# Patient Record
Sex: Male | Born: 1990 | Race: White | Hispanic: No | Marital: Married | Smoking: Former smoker
Health system: Southern US, Community
[De-identification: ages and names within clinical notes are randomized; demographics above are authoritative.]

---
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
## 2017-03-31 ENCOUNTER — Encounter: Payer: Self-pay | Admitting: *Deleted

## 2017-03-31 ENCOUNTER — Ambulatory Visit (INDEPENDENT_AMBULATORY_CARE_PROVIDER_SITE_OTHER): Payer: BLUE CROSS/BLUE SHIELD | Admitting: Family Medicine

## 2017-03-31 VITALS — BP 134/88 | HR 82 | Temp 98.0°F | Ht 68.0 in | Wt 187.0 lb

## 2017-03-31 DIAGNOSIS — Z7689 Persons encountering health services in other specified circumstances: Secondary | ICD-10-CM

## 2017-03-31 DIAGNOSIS — R6889 Other general symptoms and signs: Secondary | ICD-10-CM | POA: Diagnosis not present

## 2017-03-31 MED ORDER — BENZONATATE 200 MG PO CAPS: 200.0000 mg | ORAL_CAPSULE | Freq: Two times a day (BID) | ORAL | 0 refills | Status: DC | PRN

## 2017-03-31 NOTE — Progress Notes (Signed)
Subjective: ZO:XWRUEAVWUCC:establish care, URI symptoms HPI: Chanetta MarshallCameron A Davis is a 26 y.o. male presenting to clinic today for:  1. Cold symptoms  Patient reports he was seen at urgent care on 03/28/2017, where he had a rapid flu and rapid strep.  These were negative.  He was discharged with Magic mouthwash.  He reports that symptoms started about 5 days ago.  Endorses fever to 102F, body aches, rhinorrhea, dry cough.  He notes that he has not had recurrence of fever since start of symptoms.  Other symptoms are improving.  He was somewhat concerned because they were still not resolved.  Endorses one episode of nonbloody diarrhea.  Denies sinus pressure, headache, SOB, dizziness, rash, nausea, vomiting, chills, sick contacts, recent travel.  Patient has used ibuprofen 400 mg twice daily with some relief of symptoms.  No history of COPD or asthma.  Former smoker.  History reviewed. No pertinent past medical history. History reviewed. No pertinent surgical history. Social History   Social History  . Marital status: Married    Spouse name: N/A  . Number of children: N/A  . Years of education: N/A   Occupational History  . Not on file.   Social History Main Topics  . Smoking status: Former Smoker    Packs/day: 0.50    Years: 2.00    Quit date: 2015  . Smokeless tobacco: Never Used  . Alcohol use 6.0 oz/week    10 Cans of beer per week  . Drug use: No  . Sexual activity: Yes    Partners: Female     Comment: married   Other Topics Concern  . 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  He is married.  He has no children.   No outpatient prescriptions have been marked as taking for the 03/31/17 encounter (Office Visit) with Raliegh IpGottschalk, Akia Montalban M, DO.   Family History  Problem Relation Age of Onset  . COPD Mother   . Throat cancer Paternal Grandmother   . Colon cancer Paternal Grandfather 780   No Known Allergies   Health Maintenance:Flu  ROS: Per  HPI  Objective: Office vital signs reviewed. BP 134/88   Pulse 82   Temp 98 F (36.7 C) (Oral)   Ht 5\' 8"  (1.727 m)   Wt 187 lb (84.8 kg)   BMI 28.43 kg/m   Physical Examination:  General: Awake, alert, well nourished, nontoxic appearing male, No acute distress HEENT: Normal    Neck: No masses palpated. No lymphadenopathy    Ears: Tympanic membranes intact, normal light reflex, no erythema, no bulging    Eyes: PERRLA, extraocular movement in tact, sclera white, no ocular discharge.    Nose: nasal turbinates moist, clear nasal discharge, mild turbinate erythema and edema.    Throat: moist mucus membranes, mild oropharyngeal erythema, no tonsillar exudate.  Airway is patent Cardio: regular rate and rhythm, S1S2 heard, no murmurs appreciated Pulm: clear to auscultation bilaterally, no wheezes, rhonchi or rales; normal work of breathing on room air Skin: Good turgor.  Assessment/ Plan: 26 y.o. male   1. Flu-like symptoms Clinically improving just not resolved.  Patient is afebrile with normal vital signs today.  The urgent care note was reviewed.  He had a negative strep and negative rapid flu there.  I did recommend that he increase his ibuprofen to 600 mg every 8 hours.  Take this with food.  Push oral fluids.  Benzonatate 200 mg twice daily was  prescribed for cough.  Home care instructions were reviewed and a handout was provided.Strict return precautions and reasons for emergent evaluation in the emergency department review with patient.  They voiced understanding and will follow-up as needed.  2. Encounter to establish care with new doctor Patient with no prior PCP.  I encouraged him to make an appointment for a physical so that we can get him up-to-date on vaccines and any other preventative care.  Family history, past medical history, past surgical history, drug allergies and medications were reviewed with this patient today.  Raliegh Ip, DO [facility] [phone]

## 2017-03-31 NOTE — Patient Instructions (Addendum)
I reviewed your records from your recent visit to the urgent care.  You had a negative strep and negative flu swab there.  Therefore, these were not repeated today.  I suspect that you have a viral upper respiratory infection.  Prescribed use Tessalon Perles to use twice a day as needed for cough.  As we discussed, you may take the ibuprofen (3 tablets or 600 mg) every 8 hours as needed for body aches, fevers or headaches.  It appears that you have a viral upper respiratory infection (cold).  Cold symptoms can last up to 2 weeks.    - Get plenty of rest and drink plenty of fluids. - Try to breathe moist air. Use a humidifier or take a steamy shower. - Consume warm fluids (soup or tea) to provide relief for a stuffy nose and to loosen phlegm. - For nasal stuffiness, try saline nasal spray or a Neti Pot. - For sore throat pain relief: suck on throat lozenges, hard candy or popsicles; gargle with warm salt water (1/4 tsp. salt per 8 oz. of water); and eat soft, bland foods. - Eat a well-balanced diet. If you cannot, ensure you are getting enough nutrients by taking a daily multivitamin. - Avoid dairy products, as they can thicken phlegm. - Avoid alcohol, as it impairs your body's immune system.  CONTACT YOUR DOCTOR IF YOU EXPERIENCE ANY OF THE FOLLOWING: - High fever - Ear pain - Sinus-type headache - Unusually severe cold symptoms - Cough that gets worse while other cold symptoms improve - Flare up of any chronic lung problem, such as asthma - Your symptoms persist longer than 2 weeks

## 2017-09-22 ENCOUNTER — Ambulatory Visit: Payer: BLUE CROSS/BLUE SHIELD | Admitting: Family Medicine

## 2017-09-22 ENCOUNTER — Encounter: Payer: Self-pay | Admitting: Family Medicine

## 2017-09-22 VITALS — BP 126/88 | HR 57 | Temp 97.0°F | Ht 68.0 in | Wt 188.0 lb

## 2017-09-22 DIAGNOSIS — F411 Generalized anxiety disorder: Secondary | ICD-10-CM

## 2017-09-22 DIAGNOSIS — F410 Panic disorder [episodic paroxysmal anxiety] without agoraphobia: Secondary | ICD-10-CM

## 2017-09-22 MED ORDER — HYDROXYZINE HCL 10 MG PO TABS: 10.0000 mg | ORAL_TABLET | Freq: Three times a day (TID) | ORAL | 0 refills | Status: DC | PRN

## 2017-09-22 MED ORDER — ESCITALOPRAM OXALATE 10 MG PO TABS: 10.0000 mg | ORAL_TABLET | Freq: Every day | ORAL | 5 refills | Status: DC

## 2017-09-22 NOTE — Progress Notes (Signed)
BP 126/88   Pulse (!) 57   Temp (!) 97 F (36.1 C) (Oral)   Ht 5\' 8"  (1.727 m)   Wt 188 lb (85.3 kg)   BMI 28.59 kg/m    Subjective:    Patient ID: Andrew Marshallameron A Polendo, male    DOB: 03-11-1991, 27 y.o.   MRN: [id number]  HPI: Andrew Davis is a 27 y.o. male presenting on 09/22/2017 for Anxiety   HPI General anxiety and panic attacks Patient is coming in to discuss anxiety and panic attacks.  He says he frequently will get anxiety that builds up to the point where he started to feel chest pain and flutters.  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  He says he is having the leads safety problems now and that is when it builds up to him and he feels like every staring at them and he gets even more anxious.  He says this is been building up over the past couple years and he is finally seeking treatment because is the point where he just cannot take it anymore.  He says is been happening at other times outside of work now [employer] as well.  He denies any suicidal ideations or thoughts of hurting himself.  He denies any major feelings of depression or sadness. [facility] screen [facility] 2/9 09/22/2017 03/31/2017  Decreased Interest 2 0  Down, Depressed, Hopeless 2 0  PHQ - 2 Score 4 0  Altered sleeping 1 -  Tired, decreased energy 2 -  Change in appetite 0 -  Feeling bad or failure about yourself  1 -  Trouble concentrating 0 -  Moving slowly or fidgety/restless 1 -  Suicidal thoughts 0 -  PHQ-9 Score 9 -     Relevant past medical, surgical, family and social history reviewed and updated as indicated. Interim medical history since our last visit reviewed. Allergies and medications reviewed and updated.  Review of Systems  Constitutional: Negative for chills and fever.  Respiratory: Negative for shortness of breath and wheezing.   Cardiovascular: Negative for chest pain and leg swelling.  Musculoskeletal:  Negative for back pain and gait problem.  Skin: Negative for rash.  Neurological: Negative for dizziness, weakness, light-headedness and numbness.  Psychiatric/Behavioral: Positive for decreased concentration. Negative for dysphoric mood, self-injury, sleep disturbance and suicidal ideas. The patient is nervous/anxious.   All other systems reviewed and are negative.   Per HPI unless specifically indicated above   Allergies as of 09/22/2017   No Known Allergies     Medication List        Accurate as of 09/22/17  9:21 AM. Always use your most recent med list.          escitalopram 10 MG tablet Commonly known as:  LEXAPRO Take 1 tablet (10 mg total) by mouth daily.   hydrOXYzine 10 MG tablet Commonly known as:  ATARAX/VISTARIL Take 1 tablet (10 mg total) by mouth 3 (three) times daily as needed.          Objective:    BP 126/88   Pulse (!) 57   Temp (!) 97 F (36.1 C) (Oral)   Ht 5\' 8"  (1.727 m)   Wt 188 lb (85.3 kg)   BMI 28.59 kg/m   Wt Readings from Last 3 Encounters:  09/22/17 188 lb (85.3 kg)  03/31/17 187 lb (84.8 kg)    Physical  Exam  Constitutional: He is oriented to person, place, and time. He appears well-developed and well-nourished. No distress.  Eyes: Pupils are equal, round, and reactive to light. Conjunctivae and EOM are normal. Right eye exhibits no discharge. No scleral icterus.  Neck: Neck supple. No thyromegaly present.  Cardiovascular: Normal rate, regular rhythm, normal heart sounds and intact distal pulses.  No murmur heard. Pulmonary/Chest: Effort normal and breath sounds normal. No respiratory distress. He has no wheezes.  Lymphadenopathy:    He has no cervical adenopathy.  Neurological: He is alert and oriented to person, place, and time. Coordination normal.  Skin: Skin is warm and dry. No rash noted. He is not diaphoretic.  Psychiatric: His behavior is normal. Judgment normal. His mood appears anxious. Cognition and memory are normal.  He does not exhibit a depressed mood. He expresses no suicidal ideation. He expresses no suicidal plans.  Nursing note and vitals reviewed.   No results found for this or any previous visit.    Assessment & Plan:   Problem List Items Addressed This Visit      Other   Generalized anxiety disorder - Primary   Relevant Medications   escitalopram (LEXAPRO) 10 MG tablet   hydrOXYzine (ATARAX/VISTARIL) 10 MG tablet   Other Relevant Orders   CBC with Differential/Platelet   TSH   Panic attacks   Relevant Medications   escitalopram (LEXAPRO) 10 MG tablet   hydrOXYzine (ATARAX/VISTARIL) 10 MG tablet   Other Relevant Orders   CBC with Differential/Platelet   TSH       Follow up plan: Return in about 1 month (around 10/20/2017), or if symptoms worsen or fail to improve, for Anxiety and panic attacks recheck.  Counseling provided for all of the vaccine components Orders Placed This Encounter  Procedures  . CBC with Differential/Platelet  . TSH    Arville Care, MD [facility] 09/22/2017, 9:21 AM

## 2017-09-23 LAB — CBC WITH DIFFERENTIAL/PLATELET
BASOS: 1 %
Basophils Absolute: 0 10*3/uL (ref 0.0–0.2)
EOS (ABSOLUTE): 0.2 10*3/uL (ref 0.0–0.4)
EOS: 3 %
HEMATOCRIT: 42.4 % (ref 37.5–51.0)
HEMOGLOBIN: 14.9 g/dL (ref 13.0–17.7)
IMMATURE GRANS (ABS): 0 10*3/uL (ref 0.0–0.1)
Immature Granulocytes: 0 %
Lymphocytes Absolute: 2.3 10*3/uL (ref 0.7–3.1)
Lymphs: 38 %
MCH: 30.6 pg (ref 26.6–33.0)
MCHC: 35.1 g/dL (ref 31.5–35.7)
MCV: 87 fL (ref 79–97)
MONOCYTES: 8 %
Monocytes Absolute: 0.5 10*3/uL (ref 0.1–0.9)
Neutrophils Absolute: 3.1 10*3/uL (ref 1.4–7.0)
Neutrophils: 50 %
Platelets: 195 10*3/uL (ref 150–379)
RBC: 4.87 x10E6/uL (ref 4.14–5.80)
RDW: 12.9 % (ref 12.3–15.4)
WBC: 6 10*3/uL (ref 3.4–10.8)

## 2017-09-23 LAB — TSH: TSH: 3.81 u[IU]/mL (ref 0.450–4.500)

## 2018-01-02 ENCOUNTER — Emergency Department (HOSPITAL_COMMUNITY)
Admission: EM | Admit: 2018-01-02 | Discharge: 2018-01-02 | Disposition: A | Discharge status: Home / Self Care | Payer: Self-pay | Attending: Emergency Medicine | Admitting: Emergency Medicine

## 2018-01-02 DIAGNOSIS — Z5321 Procedure and treatment not carried out due to patient leaving prior to being seen by health care provider: Secondary | ICD-10-CM | POA: Insufficient documentation

## 2018-01-02 DIAGNOSIS — R600 Localized edema: Secondary | ICD-10-CM | POA: Insufficient documentation

## 2018-01-02 NOTE — ED Notes (Signed)
Pt was assessed for airway clearance because registration came back to notify this writer that the family was worried. Pt was able to have conversation with the writer, airway was intact; however, the extremities were noted to have swelling. Pt said he took benadryl five minutes before arrival. I told them that since his airway was clear that he would have to wait until we get some other patients seen. The pt's girlfriend was in tears when she heard that. She said, "[facility]."

## 2018-01-07 ENCOUNTER — Ambulatory Visit: Payer: BLUE CROSS/BLUE SHIELD | Admitting: Family Medicine

## 2018-01-07 ENCOUNTER — Encounter: Payer: Self-pay | Admitting: Family Medicine

## 2018-01-07 VITALS — BP 128/78 | HR 53 | Temp 97.6°F | Ht 68.0 in | Wt 189.2 lb

## 2018-01-07 DIAGNOSIS — T782XXA Anaphylactic shock, unspecified, initial encounter: Secondary | ICD-10-CM | POA: Diagnosis not present

## 2018-01-07 DIAGNOSIS — F410 Panic disorder [episodic paroxysmal anxiety] without agoraphobia: Secondary | ICD-10-CM | POA: Diagnosis not present

## 2018-01-07 DIAGNOSIS — L509 Urticaria, unspecified: Secondary | ICD-10-CM | POA: Diagnosis not present

## 2018-01-07 DIAGNOSIS — F411 Generalized anxiety disorder: Secondary | ICD-10-CM | POA: Diagnosis not present

## 2018-01-07 MED ORDER — HYDROXYZINE HCL 10 MG PO TABS: 10.0000 mg | ORAL_TABLET | Freq: Three times a day (TID) | ORAL | 1 refills | Status: DC | PRN

## 2018-01-07 MED ORDER — SERTRALINE HCL 50 MG PO TABS: 50.0000 mg | ORAL_TABLET | Freq: Every day | ORAL | 1 refills | Status: DC

## 2018-01-07 MED ORDER — EPINEPHRINE 0.3 MG/0.3ML IJ SOAJ: 0.3000 mg | Freq: Once | INTRAMUSCULAR | 1 refills | Status: AC

## 2018-01-07 NOTE — Progress Notes (Signed)
BP 128/78   Pulse (!) 53   Temp 97.6 F (36.4 C) (Oral)   Ht 5\' 8"  (1.727 m)   Wt 189 lb 3.2 oz (85.8 kg)   BMI 28.77 kg/m    Subjective:    Patient ID: Andrew Davis, male    DOB: Jul 06, 1990, 27 y.o.   MRN: [id number]  HPI: Andrew Davis is a 27 y.o. male presenting on 01/07/2018 for Anxiety   HPI Anxiety and panic attacks Patient is coming in with anxiety and panic attacks for recheck.  He says that he tried the Lexapro and feels like it did help with his mood but he had a twitch in his hand when he tried to do specific things like write.  He stopped the medication because of that but would like to try something different.  He says the hydroxyzine has been working very well to calm his nerves and he has not felt too sedated on it.  He says he still gets anxiety associated with work but feels good everywhere else, mainly the anxiousness is associated with his work and promotions that he is getting.  He denies any suicidal ideations or thoughts of hurting himself.  5 days ago allergic reaction Patient says that he was at a bar with his wife and friends and he had a couple of beers and a burger and then he developed not long after a rash that was hives over his body especially his chest and his face and had swelling in his face and his eyes and even felt scratchy in the back of his throat as well.  He has never had this reaction before and he has not had it since that time.  He cannot think of anything that he had that he has not had previously.  He denies any recent tick bites but we did consider doing alpha gal testing but he would like to hold off on it for now.  He would like to have an EpiPen.  He has no rash today.  Relevant past medical, surgical, family and social history reviewed and updated as indicated. Interim medical history since our last visit reviewed. Allergies and medications reviewed and updated.  Review of Systems  Constitutional: Negative for chills and fever.    Respiratory: Negative for shortness of breath and wheezing.   Cardiovascular: Negative for chest pain and leg swelling.  Musculoskeletal: Negative for back pain and gait problem.  Skin: Negative for rash.  Neurological: Negative for dizziness, weakness, light-headedness and numbness.  Psychiatric/Behavioral: Positive for decreased concentration. Negative for dysphoric mood, self-injury, sleep disturbance and suicidal ideas. The patient is nervous/anxious.   All other systems reviewed and are negative.   Per HPI unless specifically indicated above   Allergies as of 01/07/2018   No Known Allergies     Medication List        Accurate as of 01/07/18  8:24 AM. Always use your most recent med list.          EPINEPHrine 0.3 mg/0.3 mL Soaj injection Commonly known as:  EPI-PEN Inject 0.3 mLs (0.3 mg total) into the muscle once for 1 dose.   hydrOXYzine 10 MG tablet Commonly known as:  ATARAX/VISTARIL Take 1 tablet (10 mg total) by mouth 3 (three) times daily as needed.   sertraline 50 MG tablet Commonly known as:  ZOLOFT Take 1 tablet (50 mg total) by mouth daily.          Objective:    BP 128/78  Pulse (!) 53   Temp 97.6 F (36.4 C) (Oral)   Ht 5\' 8"  (1.727 m)   Wt 189 lb 3.2 oz (85.8 kg)   BMI 28.77 kg/m   Wt Readings from Last 3 Encounters:  01/07/18 189 lb 3.2 oz (85.8 kg)  01/02/18 185 lb (83.9 kg)  09/22/17 188 lb (85.3 kg)    Physical Exam  Constitutional: He is oriented to person, place, and time. He appears well-developed and well-nourished. No distress.  Eyes: Conjunctivae are normal. No scleral icterus.  Neck: Neck supple. No thyromegaly present.  Cardiovascular: Normal rate, regular rhythm, normal heart sounds and intact distal pulses.  No murmur heard. Pulmonary/Chest: Effort normal and breath sounds normal. No respiratory distress. He has no wheezes.  Musculoskeletal: Normal range of motion. He exhibits no edema.  Lymphadenopathy:    He has no  cervical adenopathy.  Neurological: He is alert and oriented to person, place, and time. Coordination normal.  Skin: Skin is warm and dry. No rash noted. He is not diaphoretic.  Psychiatric: His behavior is normal. Judgment normal. His mood appears anxious. He does not exhibit a depressed mood. He expresses no suicidal ideation. He expresses no suicidal plans.  Nursing note and vitals reviewed.       Assessment & Plan:   Problem List Items Addressed This Visit      Other   Generalized anxiety disorder - Primary   Relevant Medications   hydrOXYzine (ATARAX/VISTARIL) 10 MG tablet   sertraline (ZOLOFT) 50 MG tablet   Panic attacks   Relevant Medications   hydrOXYzine (ATARAX/VISTARIL) 10 MG tablet   sertraline (ZOLOFT) 50 MG tablet    Other Visit Diagnoses    Anaphylaxis, initial encounter       Relevant Medications   EPINEPHrine 0.3 mg/0.3 mL IJ SOAJ injection   Hives       Relevant Medications   EPINEPHrine 0.3 mg/0.3 mL IJ SOAJ injection       Follow up plan: Return in about 1 month (around 02/04/2018), or if symptoms worsen or fail to improve, for Anxiety and panic attacks recheck.  Counseling provided for all of the vaccine components No orders of the defined types were placed in this encounter.   Arville CareJoshua Takayla Baillie, MD [facility] 01/07/2018, 8:24 AM

## 2018-03-04 ENCOUNTER — Encounter: Payer: Self-pay | Admitting: Family Medicine

## 2018-03-04 ENCOUNTER — Ambulatory Visit (INDEPENDENT_AMBULATORY_CARE_PROVIDER_SITE_OTHER): Payer: BLUE CROSS/BLUE SHIELD | Admitting: Family Medicine

## 2018-03-04 VITALS — BP 133/78 | HR 56 | Temp 97.2°F | Ht 68.0 in | Wt 190.0 lb

## 2018-03-04 DIAGNOSIS — F411 Generalized anxiety disorder: Secondary | ICD-10-CM

## 2018-03-04 DIAGNOSIS — Z0000 Encounter for general adult medical examination without abnormal findings: Secondary | ICD-10-CM | POA: Diagnosis not present

## 2018-03-04 MED ORDER — SERTRALINE HCL 100 MG PO TABS: 100.0000 mg | ORAL_TABLET | Freq: Every day | ORAL | 3 refills | Status: DC

## 2018-03-04 NOTE — Progress Notes (Signed)
Subjective:    Patient ID: Andrew Davis, male    DOB: 08-06-1990, 27 y.o.   MRN: [id number]  Chief Complaint:  Annual Exam   HPI: Andrew Davis is a 27 y.o. male presenting on 03/04/2018 for Annual Exam  Pt presents today for his annual physical exam. Pt states he has been doing well overall. States he is still having issues with his anxiety. States he did not feel the sertraline was helping with his symptoms so he stopped taking it about a week ago. Pt states he gets anxious when he is in a meeting and has to talk to a group of people Pt states he was tolerating the medications well but did not see much of an improvement. Pt willing to restart the sertraline at a higher dose. Pt states he does not exercise on a regular basis, but works 12 hour shifts where he walks a lot during the shift. Pt states he is sexually active and states he would like the STI testing.    Relevant past medical, surgical, family and social history reviewed and updated as indicated. Interim medical history since our last visit reviewed. Allergies and medications reviewed and updated. DATA REVIEWED: CHART IN EPIC  Family History reviewed for pertinent findings.  History reviewed. No pertinent past medical history.  History reviewed. No pertinent surgical history.  Social History   Socioeconomic History  . Marital status: Married    Spouse name: Not on file  . Number of children: Not on file  . Years of education: Not on file  . Highest education level: Not on file  Occupational History  . Not on file  Social Needs  . Financial resource strain: Not on file  . Food insecurity:    Worry: Not on file    Inability: Not on file  . Transportation needs:    Medical: Not on file    Non-medical: Not on file  Tobacco Use  . Smoking status: Former Smoker    Packs/day: 0.50    Years: 2.00    Pack years: 1.00    Last attempt to quit: 2015    Years since quitting: 4.7  . Smokeless tobacco: Never Used    Substance and Sexual Activity  . Alcohol use: Yes    Comment: occasional  . Drug use: No  . Sexual activity: Yes    Partners: Female    Comment: married  Lifestyle  . Physical activity:    Days per week: Not on file    Minutes per session: Not on file  . Stress: Not on file  Relationships  . Social connections:    Talks on phone: Not on file    Gets together: Not on file    Attends religious service: Not on file    Active member of club or organization: Not on file    Attends meetings of clubs or organizations: Not on file    Relationship status: Not on file  . Intimate partner violence:    Fear of current or ex partner: Not on file    Emotionally abused: Not on file    Physically abused: Not on file    Forced sexual activity: Not on file  Other Topics Concern  . 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    Outpatient Encounter Medications as of 03/04/2018  Medication Sig  . hydrOXYzine (ATARAX/VISTARIL) 10 MG  tablet Take 1 tablet (10 mg total) by mouth 3 (three) times daily as needed. (Patient not taking: Reported on 03/04/2018)  . sertraline (ZOLOFT) 100 MG tablet Take 1 tablet (100 mg total) by mouth daily.  . [DISCONTINUED] sertraline (ZOLOFT) 50 MG tablet Take 1 tablet (50 mg total) by mouth daily. (Patient not taking: Reported on 03/04/2018)   No facility-administered encounter medications on file as of 03/04/2018.     No Known Allergies  Review of Systems  Constitutional: Negative for activity change, appetite change, chills, fatigue and fever.  HENT: Negative.   Eyes: Negative.   Respiratory: Negative for cough, chest tightness and shortness of breath.   Cardiovascular: Negative for chest pain, palpitations and leg swelling.  Gastrointestinal: Negative for blood in stool, constipation, diarrhea, nausea and vomiting.  Endocrine: Negative.   Genitourinary: Negative for discharge, dysuria,  frequency, penile pain, penile swelling, scrotal swelling, testicular pain and urgency.  Musculoskeletal: Negative for arthralgias and myalgias.  Skin: Negative.   Allergic/Immunologic: Negative.   Neurological: Negative for dizziness and headaches.  Hematological: Negative.   Psychiatric/Behavioral: Negative for confusion, hallucinations, sleep disturbance and suicidal ideas. The patient is nervous/anxious.   All other systems reviewed and are negative.       Objective:    BP 133/78   Pulse (!) 56   Temp (!) 97.2 F (36.2 C) (Oral)   Ht '5\' 8"'  (1.727 m)   Wt 190 lb (86.2 kg)   BMI 28.89 kg/m    Wt Readings from Last 3 Encounters:  03/04/18 190 lb (86.2 kg)  01/07/18 189 lb 3.2 oz (85.8 kg)  01/02/18 185 lb (83.9 kg)    Physical Exam  Constitutional: He is oriented to person, place, and time. He appears well-developed and well-nourished. He is cooperative. No distress.  HENT:  Head: Normocephalic and atraumatic.  Right Ear: Hearing, tympanic membrane, external ear and ear canal normal.  Left Ear: Hearing, tympanic membrane, external ear and ear canal normal.  Nose: Nose normal.  Mouth/Throat: Uvula is midline, oropharynx is clear and moist and mucous membranes are normal.  Eyes: Pupils are equal, round, and reactive to light. Conjunctivae, EOM and lids are normal.  Neck: Trachea normal, normal range of motion, full passive range of motion without pain and phonation normal. Neck supple. Carotid bruit is not present. No tracheal deviation present. No thyromegaly present.  Cardiovascular: Normal rate, regular rhythm, S1 normal, S2 normal, normal heart sounds, intact distal pulses and normal pulses. Exam reveals no gallop and no friction rub.  No murmur heard. Pulmonary/Chest: Effort normal and breath sounds normal. No respiratory distress. He has no wheezes. He has no rhonchi. He has no rales.  Abdominal: Soft. Normal appearance and bowel sounds are normal. There is no  hepatosplenomegaly. There is no tenderness. No hernia.  Musculoskeletal: Normal range of motion.  Lymphadenopathy:    He has no cervical adenopathy.    He has no axillary adenopathy.  Neurological: He is alert and oriented to person, place, and time. He has normal strength and normal reflexes. No cranial nerve deficit. He displays a negative Romberg sign. GCS eye subscore is 4. GCS verbal subscore is 5. GCS motor subscore is 6.  Skin: Skin is warm, dry and intact. Capillary refill takes less than 2 seconds. He is not diaphoretic.  Psychiatric: His speech is normal and behavior is normal. Judgment and thought content normal. His mood appears anxious (mildly). Cognition and memory are normal.  Nursing note and vitals reviewed.  Assessment & Plan:   1. Annual physical exam Health maintenance discussed. Healthy diet and exercise. Limit alcohol intake.  - CBC with Differential/Platelet - CMP14+EGFR - Lipid panel - TSH - STD Screen (8) - GC/Chlamydia Probe Amp([facility])  2. Generalized anxiety disorder Stress management. Increase zoloft to 100 mg per day. Follow-up in 2-4 weeks for reevaluation  - sertraline (ZOLOFT) 100 MG tablet; Take 1 tablet (100 mg total) by mouth daily.  Dispense: 30 tablet; Refill: 3   Continue all other maintenance medications.  Follow up plan: Return in about 4 weeks (around 04/01/2018), or if symptoms worsen or fail to improve.  Educational handout given for health maintenance, anxiety   The above assessment and management plan was discussed with the patient. The patient verbalized understanding of and has agreed to the management plan. Patient is aware to call the clinic if symptoms persist or worsen. Patient is aware when to return to the clinic for a follow-up visit. Patient educated on when it is appropriate to go to the emergency department.   Monia Pouch, FNP-C [facility] [phone]

## 2018-03-04 NOTE — Patient Instructions (Signed)
Living With Anxiety After being diagnosed with an anxiety disorder, you may be relieved to know why you have felt or behaved a certain way. It is natural to also feel overwhelmed about the treatment ahead and what it will mean for your life. With care and support, you can manage this condition and recover from it. How to cope with anxiety Dealing with stress Stress is your body's reaction to life changes and events, both good and bad. Stress can last just a few hours or it can be ongoing. Stress can play a major role in anxiety, so it is important to learn both how to cope with stress and how to think about it differently. Talk with your health care provider or a counselor to learn more about stress reduction. He or she may suggest some stress reduction techniques, such as:  Music therapy. This can include creating or listening to music that you enjoy and that inspires you.  Mindfulness-based meditation. This involves being aware of your normal breaths, rather than trying to control your breathing. It can be done while sitting or walking.  Centering prayer. This is a kind of meditation that involves focusing on a word, phrase, or sacred image that is meaningful to you and that brings you peace.  Deep breathing. To do this, expand your stomach and inhale slowly through your nose. Hold your breath for 3-5 seconds. Then exhale slowly, allowing your stomach muscles to relax.  Self-talk. This is a skill where you identify thought patterns that lead to anxiety reactions and correct those thoughts.  Muscle relaxation. This involves tensing muscles then relaxing them.  Choose a stress reduction technique that fits your lifestyle and personality. Stress reduction techniques take time and practice. Set aside 5-15 minutes a day to do them. Therapists can offer training in these techniques. The training may be covered by some insurance plans. Other things you can do to manage stress include:  Keeping a  stress diary. This can help you learn what triggers your stress and ways to control your response.  Thinking about how you respond to certain situations. You may not be able to control everything, but you can control your reaction.  Making time for activities that help you relax, and not feeling guilty about spending your time in this way.  Therapy combined with coping and stress-reduction skills provides the best chance for successful treatment. Medicines Medicines can help ease symptoms. Medicines for anxiety include:  Anti-anxiety drugs.  Antidepressants.  Beta-blockers.  Medicines may be used as the main treatment for anxiety disorder, along with therapy, or if other treatments are not working. Medicines should be prescribed by a health care provider. Relationships Relationships can play a big part in helping you recover. Try to spend more time connecting with trusted friends and family members. Consider going to couples counseling, taking family education classes, or going to family therapy. Therapy can help you and others better understand the condition. How to recognize changes in your condition Everyone has a different response to treatment for anxiety. Recovery from anxiety happens when symptoms decrease and stop interfering with your daily activities at home or work. This may mean that you will start to:  Have better concentration and focus.  Sleep better.  Be less irritable.  Have more energy.  Have improved memory.  It is important to recognize when your condition is getting worse. Contact your health care provider if your symptoms interfere with home or work and you do not feel like your condition   is improving. Where to find help and support: You can get help and support from these sources:  Self-help groups.  Online and [employer].  A trusted spiritual leader.  Couples counseling.  Family education classes.  Family therapy.  Follow these  instructions at home:  Eat a healthy diet that includes plenty of vegetables, fruits, whole grains, low-fat dairy products, and lean protein. Do not eat a lot of foods that are high in solid fats, added sugars, or salt.  Exercise. Most adults should do the following: ? Exercise for at least 150 minutes each week. The exercise should increase your heart rate and make you sweat (moderate-intensity exercise). ? Strengthening exercises at least twice a week.  Cut down on caffeine, tobacco, alcohol, and other potentially harmful substances.  Get the right amount and quality of sleep. Most adults need 7-9 hours of sleep each night.  Make choices that simplify your life.  Take over-the-counter and prescription medicines only as told by your health care provider.  Avoid caffeine, alcohol, and certain over-the-counter cold medicines. These may make you feel worse. Ask your pharmacist which medicines to avoid.  Keep all follow-up visits as told by your health care provider. This is important. Questions to ask your health care provider  Would I benefit from therapy?  How often should I follow up with a health care provider?  How long do I need to take medicine?  Are there any long-term side effects of my medicine?  Are there any alternatives to taking medicine? Contact a health care provider if:  You have a hard time staying focused or finishing daily tasks.  You spend many hours a day feeling worried about everyday life.  You become exhausted by worry.  You start to have headaches, feel tense, or have nausea.  You urinate more than normal.  You have diarrhea. Get help right away if:  You have a racing heart and shortness of breath.  You have thoughts of hurting yourself or others. If you ever feel like you may hurt yourself or others, or have thoughts about taking your own life, get help right away. You can go to your nearest emergency department or call:  Your local emergency  services (911 in the [place]  A suicide crisis helpline, such as the National Suicide Prevention Lifeline at 518-027-0634. This is open 24-hours a day.  Summary  Taking steps to deal with stress can help calm you.  Medicines cannot cure anxiety disorders, but they can help ease symptoms.  Family, friends, and partners can play a big part in helping you recover from an anxiety disorder. This information is not intended to replace advice given to you by your health care provider. Make sure you discuss any questions you have with your health care provider. Document Released: 05/19/2016 Document Revised: 05/19/2016 Document Reviewed: 05/19/2016 Elsevier Interactive Patient Education  2018 [employer]. Health Maintenance, Male A healthy lifestyle and preventive care is important for your health and wellness. Ask your health care provider about what schedule of regular examinations is right for you. What should I know about weight and diet? Eat a Healthy Diet  Eat plenty of vegetables, fruits, whole grains, low-fat dairy products, and lean protein.  Do not eat a lot of foods high in solid fats, added sugars, or salt.  Maintain a Healthy Weight Regular exercise can help you achieve or maintain a healthy weight. You should:  Do at least 150 minutes of exercise each week. The exercise should increase your  heart rate and make you sweat (moderate-intensity exercise).  Do strength-training exercises at least twice a week.  Watch Your Levels of Cholesterol and Blood Lipids  Have your blood tested for lipids and cholesterol every 5 years starting at 27 years of age. If you are at high risk for heart disease, you should start having your blood tested when you are 27 years old. You may need to have your cholesterol levels checked more often if: ? Your lipid or cholesterol levels are high. ? You are older than 27 years of age. ? You are at high risk for heart disease.  What should I know about  cancer screening? Many types of cancers can be detected early and may often be prevented. Lung Cancer  You should be screened every year for lung cancer if: ? You are a current smoker who has smoked for at least 30 years. ? You are a former smoker who has quit within the past 15 years.  Talk to your health care provider about your screening options, when you should start screening, and how often you should be screened.  Colorectal Cancer  Routine colorectal cancer screening usually begins at 27 years of age and should be repeated every 5-10 years until you are 27 years old. You may need to be screened more often if early forms of precancerous polyps or small growths are found. Your health care provider may recommend screening at an earlier age if you have risk factors for colon cancer.  Your health care provider may recommend using home test kits to check for hidden blood in the stool.  A small camera at the end of a tube can be used to examine your colon (sigmoidoscopy or colonoscopy). This checks for the earliest forms of colorectal cancer.  Prostate and Testicular Cancer  Depending on your age and overall health, your health care provider may do certain tests to screen for prostate and testicular cancer.  Talk to your health care provider about any symptoms or concerns you have about testicular or prostate cancer.  Skin Cancer  Check your skin from head to toe regularly.  Tell your health care provider about any new moles or changes in moles, especially if: ? There is a change in a mole's size, shape, or color. ? You have a mole that is larger than a pencil eraser.  Always use sunscreen. Apply sunscreen liberally and repeat throughout the day.  Protect yourself by wearing long sleeves, pants, a wide-brimmed hat, and sunglasses when outside.  What should I know about heart disease, diabetes, and high blood pressure?  If you are 43-60 years of age, have your blood pressure  checked every 3-5 years. If you are 63 years of age or older, have your blood pressure checked every year. You should have your blood pressure measured twice-once when you are at a hospital or clinic, and once when you are not at a hospital or clinic. Record the average of the two measurements. To check your blood pressure when you are not at a hospital or clinic, you can use: ? An automated blood pressure machine at a pharmacy. ? A home blood pressure monitor.  Talk to your health care provider about your target blood pressure.  If you are between 67-73 years old, ask your health care provider if you should take aspirin to prevent heart disease.  Have regular diabetes screenings by checking your fasting blood sugar level. ? If you are at a normal weight and have a low  risk for diabetes, have this test once every three years after the age of 67. ? If you are overweight and have a high risk for diabetes, consider being tested at a younger age or more often.  A one-time screening for abdominal aortic aneurysm (AAA) by ultrasound is recommended for men aged 65-75 years who are current or former smokers. What should I know about preventing infection? Hepatitis B If you have a higher risk for hepatitis B, you should be screened for this virus. Talk with your health care provider to find out if you are at risk for hepatitis B infection. Hepatitis C Blood testing is recommended for:  Everyone born from [birth year] through 1965.  Anyone with known risk factors for hepatitis C.  Sexually Transmitted Diseases (STDs)  You should be screened each year for STDs including gonorrhea and chlamydia if: ? You are sexually active and are younger than 28 years of age. ? You are older than 27 years of age and your health care provider tells you that you are at risk for this type of infection. ? Your sexual activity has changed since you were last screened and you are at an increased risk for chlamydia or gonorrhea.  Ask your health care provider if you are at risk.  Talk with your health care provider about whether you are at high risk of being infected with HIV. Your health care provider may recommend a prescription medicine to help prevent HIV infection.  What else can I do?  Schedule regular health, dental, and eye exams.  Stay current with your vaccines (immunizations).  Do not use any tobacco products, such as cigarettes, chewing tobacco, and e-cigarettes. If you need help quitting, ask your health care provider.  Limit alcohol intake to no more than 2 drinks per day. One drink equals 12 ounces of beer, 5 ounces of wine, or 1 ounces of hard liquor.  Do not use street drugs.  Do not share needles.  Ask your health care provider for help if you need support or information about quitting drugs.  Tell your health care provider if you often feel depressed.  Tell your health care provider if you have ever been abused or do not feel safe at home. This information is not intended to replace advice given to you by your health care provider. Make sure you discuss any questions you have with your health care provider. Document Released: 11/21/2007 Document Revised: 01/22/2016 Document Reviewed: 02/26/2015 Elsevier Interactive Patient Education  [employer].

## 2018-03-05 LAB — CMP14+EGFR
ALBUMIN: 4.7 g/dL (ref 3.5–5.5)
ALT: 125 IU/L — ABNORMAL HIGH (ref 0–44)
AST: 89 IU/L — ABNORMAL HIGH (ref 0–40)
Albumin/Globulin Ratio: 2.1 (ref 1.2–2.2)
Alkaline Phosphatase: 62 IU/L (ref 39–117)
BUN / CREAT RATIO: 10 (ref 9–20)
BUN: 11 mg/dL (ref 6–20)
Bilirubin Total: 0.6 mg/dL (ref 0.0–1.2)
CO2: 25 mmol/L (ref 20–29)
CREATININE: 1.11 mg/dL (ref 0.76–1.27)
Calcium: 10 mg/dL (ref 8.7–10.2)
Chloride: 101 mmol/L (ref 96–106)
GFR calc non Af Amer: 91 mL/min/{1.73_m2} (ref >59)
GFR, EST AFRICAN AMERICAN: 105 mL/min/{1.73_m2} (ref >59)
GLUCOSE: 95 mg/dL (ref 65–99)
Globulin, Total: 2.2 g/dL (ref 1.5–4.5)
Potassium: 3.7 mmol/L (ref 3.5–5.2)
Sodium: 142 mmol/L (ref 134–144)
TOTAL PROTEIN: 6.9 g/dL (ref 6.0–8.5)

## 2018-03-05 LAB — CBC WITH DIFFERENTIAL/PLATELET
BASOS ABS: 0.1 10*3/uL (ref 0.0–0.2)
Basos: 1 %
EOS (ABSOLUTE): 0.4 10*3/uL (ref 0.0–0.4)
EOS: 7 %
HEMOGLOBIN: 14.8 g/dL (ref 13.0–17.7)
Hematocrit: 43.5 % (ref 37.5–51.0)
IMMATURE GRANS (ABS): 0 10*3/uL (ref 0.0–0.1)
Immature Granulocytes: 0 %
Lymphocytes Absolute: 2 10*3/uL (ref 0.7–3.1)
Lymphs: 39 %
MCH: 29.5 pg (ref 26.6–33.0)
MCHC: 34 g/dL (ref 31.5–35.7)
MCV: 87 fL (ref 79–97)
MONOCYTES: 8 %
Monocytes Absolute: 0.4 10*3/uL (ref 0.1–0.9)
Neutrophils Absolute: 2.4 10*3/uL (ref 1.4–7.0)
Neutrophils: 45 %
Platelets: 198 10*3/uL (ref 150–450)
RBC: 5.01 x10E6/uL (ref 4.14–5.80)
RDW: 12.2 % — ABNORMAL LOW (ref 12.3–15.4)
WBC: 5.3 10*3/uL (ref 3.4–10.8)

## 2018-03-05 LAB — LIPID PANEL
CHOL/HDL RATIO: 5.8 ratio — AB (ref 0.0–5.0)
CHOLESTEROL TOTAL: 169 mg/dL (ref 100–199)
HDL: 29 mg/dL — ABNORMAL LOW (ref >39)
LDL Calculated: 87 mg/dL (ref 0–99)
Triglycerides: 265 mg/dL — ABNORMAL HIGH (ref 0–149)
VLDL Cholesterol Cal: 53 mg/dL — ABNORMAL HIGH (ref 5–40)

## 2018-03-05 LAB — TSH: TSH: 2.46 u[IU]/mL (ref 0.450–4.500)

## 2018-03-05 LAB — STD SCREEN (8)
HEP A IGM: NEGATIVE
HEP B S AG: NEGATIVE
HIV Screen 4th Generation wRfx: NONREACTIVE
HSV 1 Glycoprotein G Ab, IgG: 47.1 index — ABNORMAL HIGH (ref 0.00–0.90)
HSV 2 IgG, Type Spec: <0.91 index (ref 0.00–0.90)
Hep B C IgM: NEGATIVE
Hep C Virus Ab: <0.1 s/co ratio (ref 0.0–0.9)
RPR: NONREACTIVE

## 2018-03-07 ENCOUNTER — Other Ambulatory Visit: Payer: Self-pay | Admitting: Family Medicine

## 2018-03-07 DIAGNOSIS — R748 Abnormal levels of other serum enzymes: Secondary | ICD-10-CM | POA: Insufficient documentation

## 2018-03-07 DIAGNOSIS — E782 Mixed hyperlipidemia: Secondary | ICD-10-CM | POA: Insufficient documentation

## 2018-03-07 LAB — GC/CHLAMYDIA PROBE AMP
Chlamydia trachomatis, NAA: NEGATIVE
Neisseria gonorrhoeae by PCR: NEGATIVE

## 2018-03-07 NOTE — Progress Notes (Signed)
Mildly elevated liver enzymes, Hepatitis panel negative. RUQ [place] ordered.

## 2018-03-09 ENCOUNTER — Telehealth: Payer: Self-pay | Admitting: Family Medicine

## 2018-03-09 NOTE — Telephone Encounter (Signed)
Message left to call

## 2018-03-15 ENCOUNTER — Ambulatory Visit (HOSPITAL_COMMUNITY)
Admission: RE | Admit: 2018-03-15 | Discharge: 2018-03-15 | Disposition: A | Discharge status: Home / Self Care | Payer: Managed Care, Other (non HMO) | Source: Ambulatory Visit | Attending: Family Medicine | Admitting: Family Medicine

## 2018-03-15 DIAGNOSIS — R748 Abnormal levels of other serum enzymes: Secondary | ICD-10-CM

## 2018-03-15 DIAGNOSIS — R932 Abnormal findings on diagnostic imaging of liver and biliary tract: Secondary | ICD-10-CM | POA: Insufficient documentation

## 2018-03-17 ENCOUNTER — Ambulatory Visit (HOSPITAL_COMMUNITY): Payer: Self-pay

## 2018-05-20 ENCOUNTER — Ambulatory Visit (INDEPENDENT_AMBULATORY_CARE_PROVIDER_SITE_OTHER): Payer: Managed Care, Other (non HMO) | Admitting: Family Medicine

## 2018-05-20 ENCOUNTER — Encounter: Payer: Self-pay | Admitting: Family Medicine

## 2018-05-20 VITALS — BP 135/82 | HR 75 | Temp 97.1°F | Ht 68.0 in | Wt 198.0 lb

## 2018-05-20 DIAGNOSIS — J0100 Acute maxillary sinusitis, unspecified: Secondary | ICD-10-CM | POA: Diagnosis not present

## 2018-05-20 MED ORDER — AMOXICILLIN-POT CLAVULANATE 875-125 MG PO TABS: 1.0000 | ORAL_TABLET | Freq: Two times a day (BID) | ORAL | 0 refills | Status: AC

## 2018-05-20 MED ORDER — CETIRIZINE HCL 10 MG PO TABS: 10.0000 mg | ORAL_TABLET | Freq: Every day | ORAL | 11 refills | Status: DC

## 2018-05-20 MED ORDER — FLUTICASONE PROPIONATE 50 MCG/ACT NA SUSP: 2.0000 | Freq: Every day | NASAL | 6 refills | Status: DC

## 2018-05-20 NOTE — Patient Instructions (Signed)

## 2018-05-20 NOTE — Progress Notes (Signed)
Subjective:    Patient ID: Andrew Davis, male    DOB: 16-Jan-1991, 27 y.o.   MRN: [id number]  Chief Complaint:  Sinusitis (sinus congestion and pressure, headache, sinus pain)   HPI: Andrew Davis is a 27 y.o. male presenting on 05/20/2018 for Sinusitis (sinus congestion and pressure, headache, sinus pain)  Pt presents today with complaints of cough, congestion, sinus pressure, headache, chills, fatigue, and sore throat. States this started several days ago and is progressively getting worse. States his cough is productive. He has purulent rhinorrhea. States he has tried sinus nasal sprays with minimal relief. He states the dust at his place of employment makes the symptoms worse.   Relevant past medical, surgical, family, and social history reviewed and updated as indicated.  Allergies and medications reviewed and updated.   History reviewed. No pertinent past medical history.  History reviewed. No pertinent surgical history.  Social History   Socioeconomic History  . Marital status: Married    Spouse name: Not on file  . Number of children: Not on file  . Years of education: Not on file  . Highest education level: Not on file  Occupational History  . Not on file  Social Needs  . Financial resource strain: Not on file  . Food insecurity:    Worry: Not on file    Inability: Not on file  . Transportation needs:    Medical: Not on file    Non-medical: Not on file  Tobacco Use  . Smoking status: Former Smoker    Packs/day: 0.50    Years: 2.00    Pack years: 1.00    Last attempt to quit: 2015    Years since quitting: 4.9  . Smokeless tobacco: Never Used  Substance and Sexual Activity  . Alcohol use: Yes    Comment: occasional  . Drug use: No  . Sexual activity: Yes    Partners: Female    Comment: married  Lifestyle  . Physical activity:    Days per week: Not on file    Minutes per session: Not on file  . Stress: Not on file  Relationships  . Social  connections:    Talks on phone: Not on file    Gets together: Not on file    Attends religious service: Not on file    Active member of club or organization: Not on file    Attends meetings of clubs or organizations: Not on file    Relationship status: Not on file  . Intimate partner violence:    Fear of current or ex partner: Not on file    Emotionally abused: Not on file    Physically abused: Not on file    Forced sexual activity: Not on file  Other Topics Concern  . 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    Outpatient Encounter Medications as of 05/20/2018  Medication Sig  . sertraline (ZOLOFT) 100 MG tablet Take 1 tablet (100 mg total) by mouth daily.  Marland Kitchen amoxicillin-clavulanate (AUGMENTIN) 875-125 MG tablet Take 1 tablet by mouth 2 (two) times daily for 7 days.  . cetirizine (ZYRTEC) 10 MG tablet Take 1 tablet (10 mg total) by mouth daily.  . fluticasone (FLONASE) 50 MCG/ACT nasal spray Place 2 sprays into both nostrils daily.  . [DISCONTINUED] hydrOXYzine (ATARAX/VISTARIL) 10 MG tablet Take 1 tablet (10 mg total) by mouth 3 (three)  times daily as needed. (Patient not taking: Reported on 03/04/2018)   No facility-administered encounter medications on file as of 05/20/2018.     No Known Allergies  Review of Systems  Constitutional: Positive for chills and fatigue. Negative for fever.  HENT: Positive for congestion, postnasal drip, rhinorrhea, sinus pressure, sinus pain and sore throat.   Eyes: Negative for photophobia and visual disturbance.  Respiratory: Positive for cough. Negative for shortness of breath.   Cardiovascular: Negative for chest pain, palpitations and leg swelling.  Gastrointestinal: Negative for abdominal pain, constipation, diarrhea, nausea and vomiting.  Musculoskeletal: Negative for myalgias.  Neurological: Positive for headaches. Negative for dizziness, weakness and  light-headedness.  Psychiatric/Behavioral: Negative for confusion.  All other systems reviewed and are negative.       Objective:    BP 135/82   Pulse 75   Temp (!) 97.1 F (36.2 C) (Oral)   Ht _0  (1.727 m)   Wt 198 lb (89.8 kg)   BMI 30.11 kg/m    Wt Readings from Last 3 Encounters:  05/20/18 198 lb (89.8 kg)  03/04/18 190 lb (86.2 kg)  01/07/18 189 lb 3.2 oz (85.8 kg)    Physical Exam Vitals signs and nursing note reviewed.  Constitutional:      General: He is not in acute distress.    Appearance: Normal appearance.  HENT:     Head: Normocephalic and atraumatic.     Right Ear: Hearing, tympanic membrane, ear canal and external ear normal.     Left Ear: Hearing, tympanic membrane, ear canal and external ear normal.     Nose: Congestion and rhinorrhea present.     Right Turbinates: Swollen.     Left Turbinates: Swollen.     Right Sinus: Maxillary sinus tenderness present. No frontal sinus tenderness.     Left Sinus: Maxillary sinus tenderness present. No frontal sinus tenderness.     Mouth/Throat:     Lips: Pink.     Mouth: Mucous membranes are moist.     Pharynx: Posterior oropharyngeal erythema present. No oropharyngeal exudate.     Tonsils: No tonsillar exudate or tonsillar abscesses.  Eyes:     Conjunctiva/sclera: Conjunctivae normal.     Pupils: Pupils are equal, round, and reactive to light.  Neck:     Musculoskeletal: Normal range of motion and neck Davis.  Cardiovascular:     Rate and Rhythm: Normal rate and regular rhythm.     Heart sounds: Normal heart sounds. No murmur. No friction rub. No gallop.   Lymphadenopathy:     Head:     Right side of head: Tonsillar adenopathy present.     Left side of head: Tonsillar adenopathy present.     Cervical: No cervical adenopathy.  Skin:    General: Skin is warm and dry.     Capillary Refill: Capillary refill takes less than 2 seconds.  Neurological:     Mental Status: He is alert and oriented to person,  place, and time.  Psychiatric:        Mood and Affect: Mood normal.        Behavior: Behavior normal. Behavior is cooperative.        Thought Content: Thought content normal.        Judgment: Judgment normal.     Results for orders placed or performed in visit on 03/04/18  GC/Chlamydia Probe Amp([facility])  Result Value Ref Range   Chlamydia trachomatis, NAA Negative Negative   Neisseria gonorrhoeae by PCR Negative Negative  CBC with Differential/Platelet  Result Value Ref Range   WBC 5.3 3.4 - 10.8 x10E3/uL   RBC 5.01 4.14 - 5.80 x10E6/uL   Hemoglobin 14.8 13.0 - 17.7 g/dL   Hematocrit 43.5 37.5 - 51.0 %   MCV 87 79 - 97 fL   MCH 29.5 26.6 - 33.0 pg   MCHC 34.0 31.5 - 35.7 g/dL   RDW 12.2 (L) 12.3 - 15.4 %   Platelets 198 150 - 450 x10E3/uL   Neutrophils 45 Not Estab. %   Lymphs 39 Not Estab. %   Monocytes 8 Not Estab. %   Eos 7 Not Estab. %   Basos 1 Not Estab. %   Neutrophils Absolute 2.4 1.4 - 7.0 x10E3/uL   Lymphocytes Absolute 2.0 0.7 - 3.1 x10E3/uL   Monocytes Absolute 0.4 0.1 - 0.9 x10E3/uL   EOS (ABSOLUTE) 0.4 0.0 - 0.4 x10E3/uL   Basophils Absolute 0.1 0.0 - 0.2 x10E3/uL   Immature Granulocytes 0 Not Estab. %   Immature Grans (Abs) 0.0 0.0 - 0.1 x10E3/uL  CMP14+EGFR  Result Value Ref Range   Glucose 95 65 - 99 mg/dL   BUN 11 6 - 20 mg/dL   Creatinine, Ser 1.11 0.76 - 1.27 mg/dL   GFR calc non Af Amer 91 >59 mL/min/1.73   GFR calc Af Amer 105 >59 mL/min/1.73   BUN/Creatinine Ratio 10 9 - 20   Sodium 142 134 - 144 mmol/L   Potassium 3.7 3.5 - 5.2 mmol/L   Chloride 101 96 - 106 mmol/L   CO2 25 20 - 29 mmol/L   Calcium 10.0 8.7 - 10.2 mg/dL   Total Protein 6.9 6.0 - 8.5 g/dL   Albumin 4.7 3.5 - 5.5 g/dL   Globulin, Total 2.2 1.5 - 4.5 g/dL   Albumin/Globulin Ratio 2.1 1.2 - 2.2   Bilirubin Total 0.6 0.0 - 1.2 mg/dL   Alkaline Phosphatase 62 39 - 117 IU/L   AST 89 (H) 0 - 40 IU/L   ALT 125 (H) 0 - 44 IU/L  Lipid panel  Result Value Ref Range    Cholesterol, Total 169 100 - 199 mg/dL   Triglycerides 265 (H) 0 - 149 mg/dL   HDL 29 (L) >39 mg/dL   VLDL Cholesterol Cal 53 (H) 5 - 40 mg/dL   LDL Calculated 87 0 - 99 mg/dL   Chol/HDL Ratio 5.8 (H) 0.0 - 5.0 ratio  TSH  Result Value Ref Range   TSH 2.460 0.450 - 4.500 uIU/mL  STD Screen (8)  Result Value Ref Range   Hep A IgM Negative Negative   Hepatitis B Surface Ag Negative Negative   Hep B C IgM Negative Negative   Hep C Virus Ab <0.1 0.0 - 0.9 s/co ratio   RPR Ser Ql Non Reactive Non Reactive   HIV Screen 4th Generation wRfx Non Reactive Non Reactive   HSV 1 Glycoprotein G Ab, IgG 47.10 (H) 0.00 - 0.90 index   HSV 2 IgG, Type Spec <0.91 0.00 - 0.90 index       Pertinent labs & imaging results that were available during my care of the patient were reviewed by me and considered in my medical decision making.  Assessment & Plan:  Andrew Davis was seen today for sinusitis.  Diagnoses and all orders for this visit:  Acute non-recurrent maxillary sinusitis Increase fluid intake. Avoid cigarette smoke. Frequent saline nasal sprays. Mucinex if beneficial. Medications as prescribed. -     fluticasone (FLONASE) 50 MCG/ACT nasal spray; Place  2 sprays into both nostrils daily. -     cetirizine (ZYRTEC) 10 MG tablet; Take 1 tablet (10 mg total) by mouth daily. -     amoxicillin-clavulanate (AUGMENTIN) 875-125 MG tablet; Take 1 tablet by mouth 2 (two) times daily for 7 days.   Continue all other maintenance medications.  Follow up plan: Return if symptoms worsen or fail to improve.  Educational handout given for sinusitis   The above assessment and management plan was discussed with the patient. The patient verbalized understanding of and has agreed to the management plan. Patient is aware to call the clinic if symptoms persist or worsen. Patient is aware when to return to the clinic for a follow-up visit. Patient educated on when it is appropriate to go to the emergency department.     Monia Pouch, FNP-C [facility] [phone]

## 2018-05-28 ENCOUNTER — Encounter: Payer: Self-pay | Admitting: Family Medicine

## 2018-05-28 ENCOUNTER — Ambulatory Visit (INDEPENDENT_AMBULATORY_CARE_PROVIDER_SITE_OTHER): Payer: Managed Care, Other (non HMO) | Admitting: Family Medicine

## 2018-05-28 VITALS — BP 126/82 | HR 81 | Temp 98.6°F | Ht 68.0 in | Wt 195.0 lb

## 2018-05-28 DIAGNOSIS — A084 Viral intestinal infection, unspecified: Secondary | ICD-10-CM

## 2018-05-28 NOTE — Progress Notes (Signed)
BP 126/82   Pulse 81   Temp 98.6 F (37 C) (Oral)   Ht 5\' 8"  (1.727 m)   Wt 195 lb (88.5 kg)   BMI 29.65 kg/m    Subjective:    Patient ID: Andrew Davis, male    DOB: April 25, 1991, 27 y.o.   MRN: [id number]  HPI: Andrew Davis is a 27 y.o. male presenting on 05/28/2018 for Fever, not feeling well (finished antibiotic and felt better but started running fever  yesterday)   HPI Fevers and aches and diarrhea Patient comes in today with complaints of fevers and aches and diarrhea that has been going on since yesterday.  He did have a sinus infection a week ago and took an antibiotic which he finished yesterday and he was having diarrhea while he was on the antibiotic but then since then the diarrhea has developed into where he is having some nausea and some achiness and sweats over last night.  He says his temperature was 100.9 last night as well.  He denies any blood in the stool and he denies any vomiting to this point.  He denies any sick contacts that he knows of and has not taken anything for this illness but has the diarrhea.  He is having diarrhea about 3-4 times a day currently.  Relevant past medical, surgical, family and social history reviewed and updated as indicated. Interim medical history since our last visit reviewed. Allergies and medications reviewed and updated.  Review of Systems  Constitutional: Negative for chills and fever.  HENT: Negative for congestion, rhinorrhea, sinus pressure, sinus pain, sneezing and sore throat.   Respiratory: Negative for cough, shortness of breath and wheezing.   Cardiovascular: Negative for chest pain and leg swelling.  Gastrointestinal: Positive for diarrhea and nausea. Negative for abdominal distention, abdominal pain, blood in stool, constipation and vomiting.  Musculoskeletal: Negative for back pain and gait problem.  Skin: Negative for rash.  All other systems reviewed and are negative.   Per HPI unless specifically indicated  above   Allergies as of 05/28/2018   No Known Allergies     Medication List       Accurate as of May 28, 2018 11:34 AM. Always use your most recent med list.        cetirizine 10 MG tablet Commonly known as:  ZYRTEC Take 1 tablet (10 mg total) by mouth daily.   fluticasone 50 MCG/ACT nasal spray Commonly known as:  FLONASE Place 2 sprays into both nostrils daily.   sertraline 100 MG tablet Commonly known as:  ZOLOFT Take 1 tablet (100 mg total) by mouth daily.          Objective:    BP 126/82   Pulse 81   Temp 98.6 F (37 C) (Oral)   Ht 5\' 8"  (1.727 m)   Wt 195 lb (88.5 kg)   BMI 29.65 kg/m   Wt Readings from Last 3 Encounters:  05/28/18 195 lb (88.5 kg)  05/20/18 198 lb (89.8 kg)  03/04/18 190 lb (86.2 kg)    Physical Exam Vitals signs and nursing note reviewed.  Constitutional:      General: He is not in acute distress.    Appearance: He is well-developed. He is not diaphoretic.  Eyes:     General: No scleral icterus.    Conjunctiva/sclera: Conjunctivae normal.  Abdominal:     General: Abdomen is flat. Bowel sounds are normal. There is no distension.     Tenderness: There  is no abdominal tenderness.  Musculoskeletal: Normal range of motion.  Skin:    General: Skin is warm and dry.     Findings: No rash.  Neurological:     Mental Status: He is alert and oriented to person, place, and time.     Coordination: Coordination normal.  Psychiatric:        Behavior: Behavior normal.         Assessment & Plan:   Problem List Items Addressed This Visit    None    Visit Diagnoses    Viral gastroenteritis    -  Primary   Recommended conservative management and fluids and rest and because he just finished an antibiotic if he still having diarrhea on Monday bring C. diff test   Relevant Orders   Clostridium difficile EIA       Follow up plan: Return if symptoms worsen or fail to improve.  Counseling provided for all of the vaccine  components Orders Placed This Encounter  Procedures  . Clostridium difficile EIA    Arville CareJoshua Winslow Ederer, MD [facility] 05/28/2018, 11:34 AM

## 2018-11-27 IMAGING — US US ABDOMEN LIMITED
1 series · 14 of 25 positions shown · non-contrast
Comparison: None.

CLINICAL DATA: Elevated liver function studies.

EXAM:
ULTRASOUND ABDOMEN LIMITED RIGHT UPPER QUADRANT

[Series 1: us abdomen limited · 0.21mm/px · 14 of 39 slices shown]
[im 1/39]
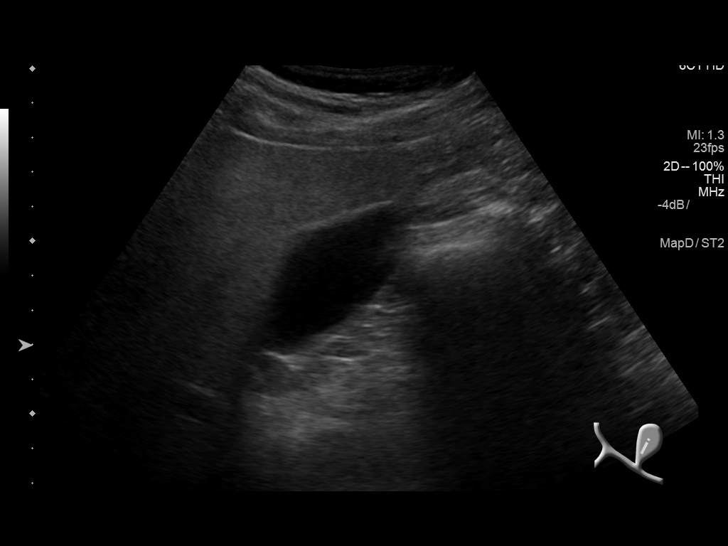
[im 4/39]
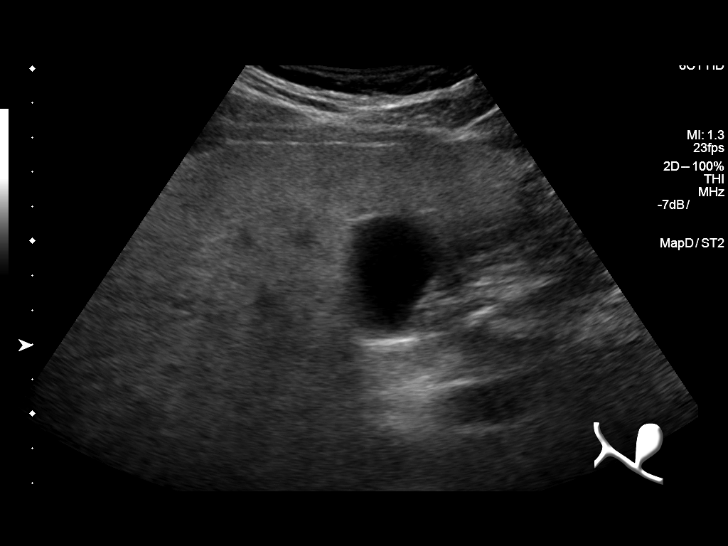
[im 7/39]
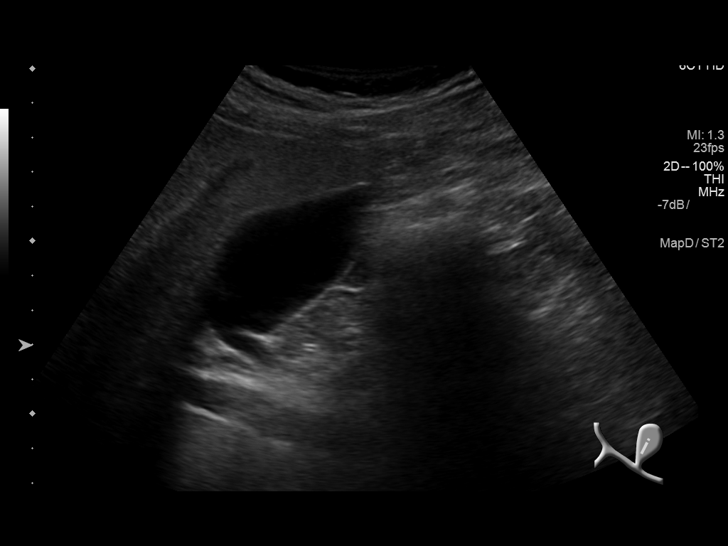
[im 10/39]
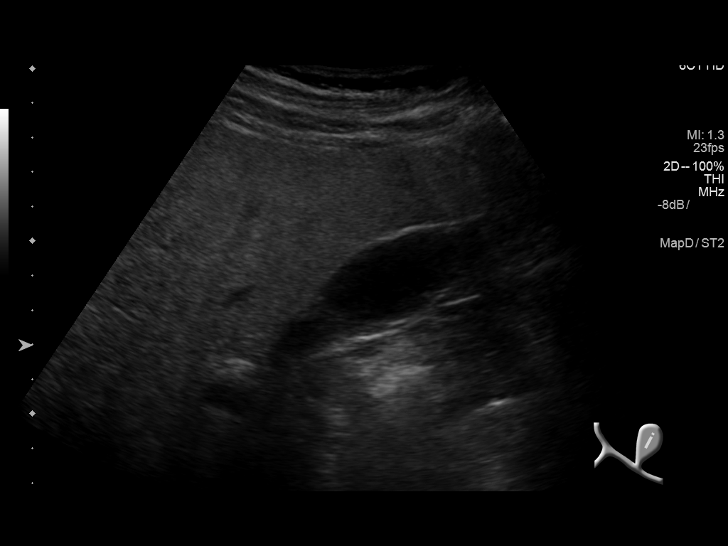
[im 13/39]
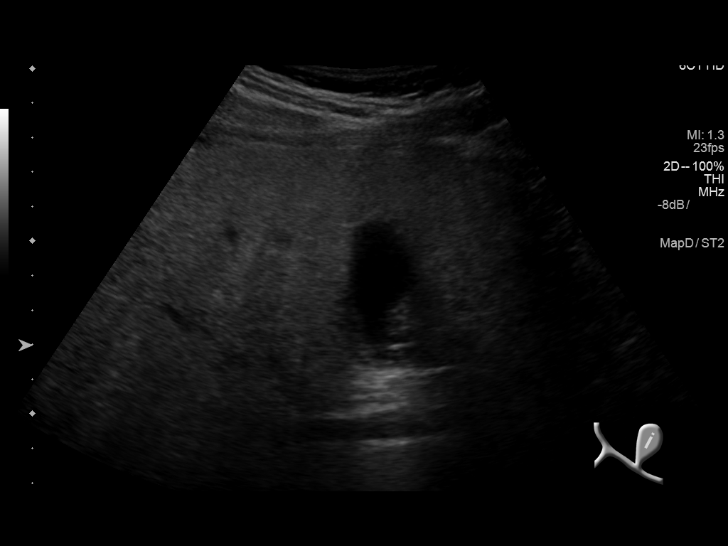
[im 15/39]
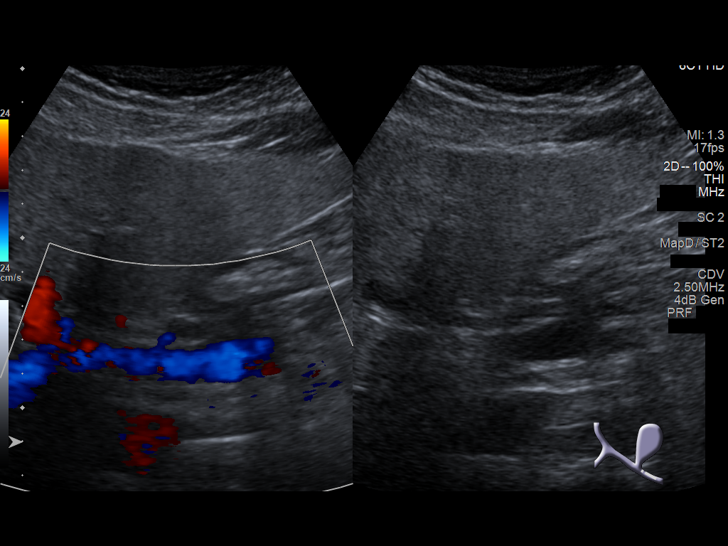
[im 18/39]
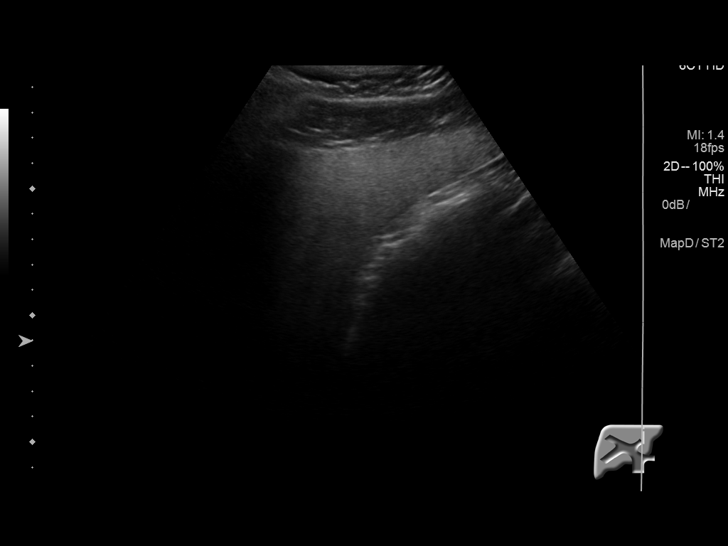
[im 21/39]
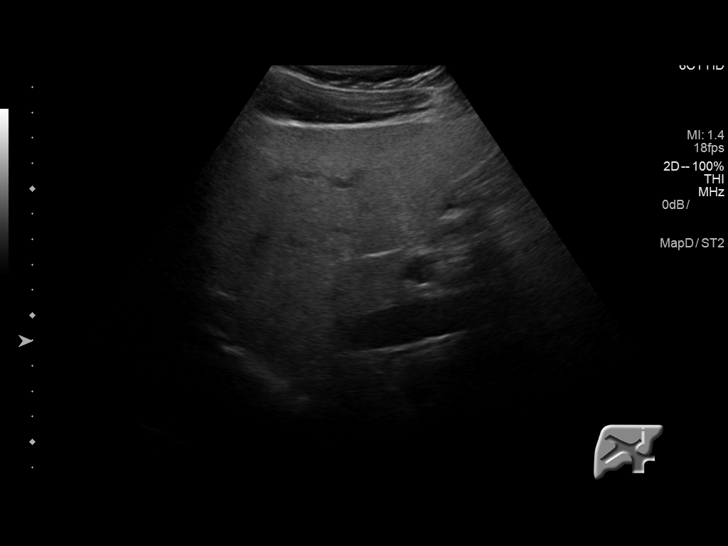
[im 24/39]
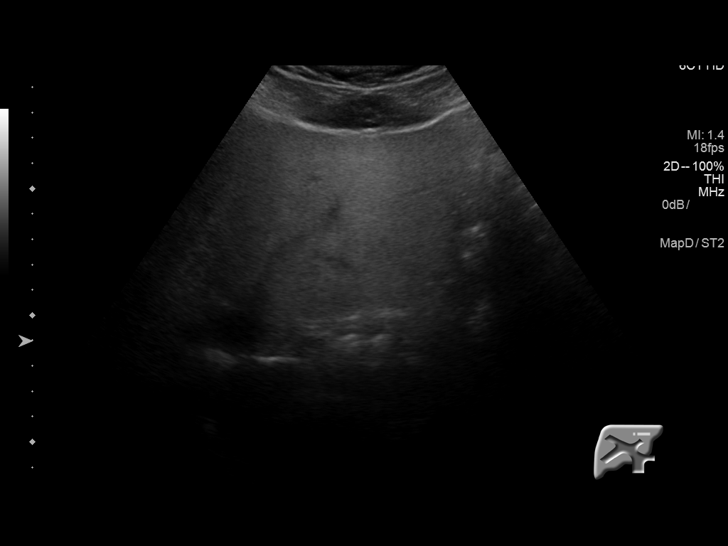
[im 26/39]
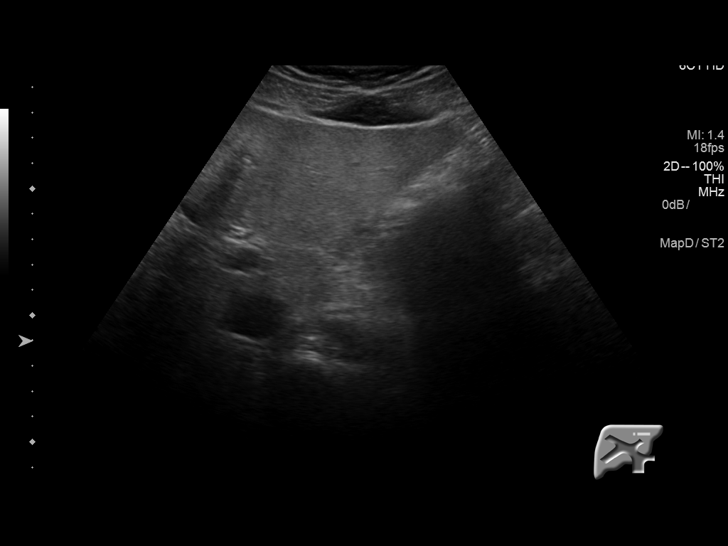
[im 29/39]
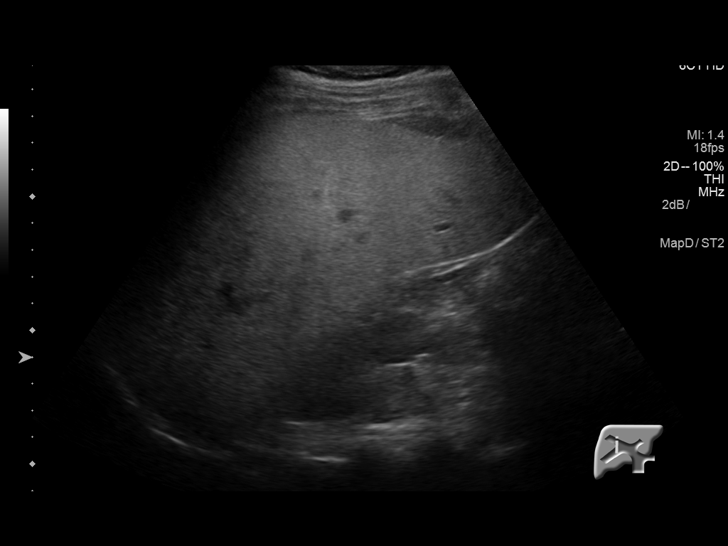
[im 32/39]
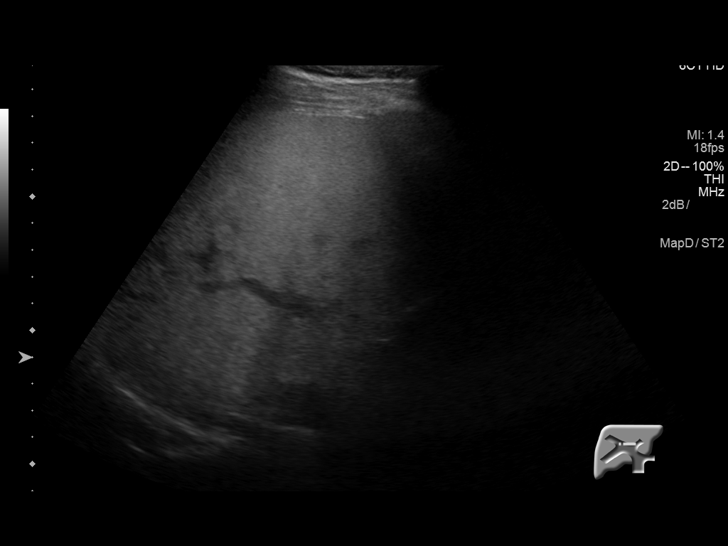
[im 35/39]
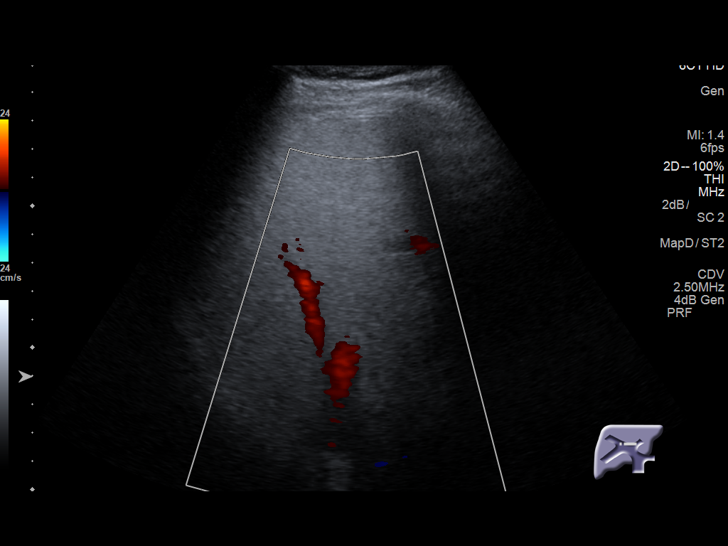
[im 39/39]
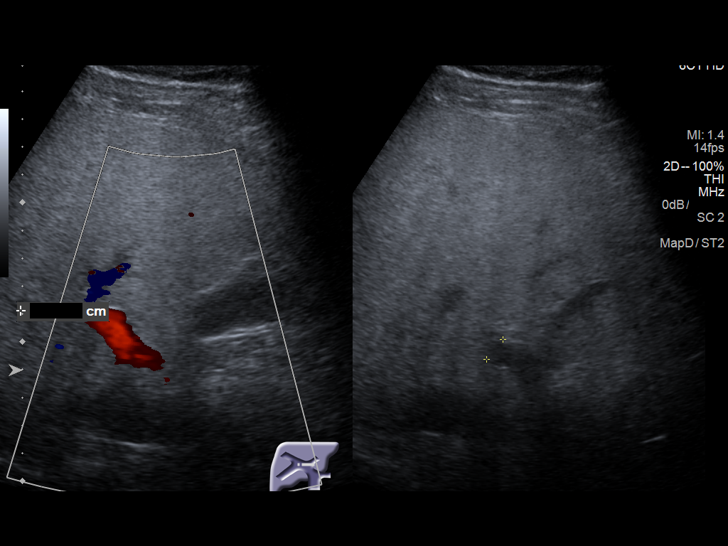

[14 of 25 positions shown; findings below may reference images not displayed]

FINDINGS: Gallbladder:

No gallstones or sludge visualized. Top-normal gallbladder wall
thickness at 3.1 mm. No sonographic Murphy sign noted by
sonographer.

Common bile duct:

Diameter: 4 mm

Liver:

The hepatic echotexture is mildly increased. The surface contour is
smooth. There is no focal mass nor ductal dilation. Portal vein is
patent on color Doppler imaging with normal direction of blood flow
towards the liver.
IMPRESSION: Increased hepatic echotexture is most compatible with fatty
infiltrative change. No suspicious masses.

Top-normal gallbladder wall thickness is without evidence of stones
or sludge or acute cholecystitis. If there are clinical concerns of
gallbladder dysfunction, a nuclear medicine hepatobiliary scan with
gallbladder ejection fraction determination may be useful.

## 2019-04-24 ENCOUNTER — Ambulatory Visit (INDEPENDENT_AMBULATORY_CARE_PROVIDER_SITE_OTHER): Payer: Managed Care, Other (non HMO) | Admitting: *Deleted

## 2019-04-24 DIAGNOSIS — Z23 Encounter for immunization: Secondary | ICD-10-CM | POA: Diagnosis not present

## 2020-03-26 ENCOUNTER — Other Ambulatory Visit: Payer: Self-pay

## 2020-03-26 ENCOUNTER — Ambulatory Visit (INDEPENDENT_AMBULATORY_CARE_PROVIDER_SITE_OTHER): Payer: 59 | Admitting: Family Medicine

## 2020-03-26 ENCOUNTER — Encounter: Payer: Self-pay | Admitting: Family Medicine

## 2020-03-26 DIAGNOSIS — R6883 Chills (without fever): Secondary | ICD-10-CM

## 2020-03-26 DIAGNOSIS — R52 Pain, unspecified: Secondary | ICD-10-CM | POA: Diagnosis not present

## 2020-03-26 DIAGNOSIS — R509 Fever, unspecified: Secondary | ICD-10-CM | POA: Diagnosis not present

## 2020-03-26 NOTE — Progress Notes (Signed)
Subjective:    Patient ID: Andrew Davis, male    DOB: 1990-09-13, 29 y.o.   MRN: [id number]   HPI: Andrew Davis is a 29 y.o. male presenting for onset of 101.6 last night. Cold chills then hot with sweats. Fever broke this am. Fatigued. Denies cough. Has HA and body aches. Restless sleep. Okay right now. Resting. Still off and on chills and sweats. Using ibuprofen. No loss of taste or smell. Kind of short of breath.    Depression screen [facility] 2/9 05/28/2018 05/20/2018 03/04/2018 01/07/2018 09/22/2017  Decreased Interest 0 0 0 0 2  Down, Depressed, Hopeless 0 0 0 0 2  PHQ - 2 Score 0 0 0 0 4  Altered sleeping - - - - 1  Tired, decreased energy - - - - 2  Change in appetite - - - - 0  Feeling bad or failure about yourself  - - - - 1  Trouble concentrating - - - - 0  Moving slowly or fidgety/restless - - - - 1  Suicidal thoughts - - - - 0  PHQ-9 Score - - - - 9     Relevant past medical, surgical, family and social history reviewed and updated as indicated.  Interim medical history since our last visit reviewed. Allergies and medications reviewed and updated.  ROS:  Review of Systems  Constitutional: Negative for activity change, appetite change, chills and fever.  HENT: Positive for congestion and postnasal drip. Negative for ear discharge, ear pain, hearing loss, nosebleeds, sinus pressure, sneezing and trouble swallowing.   Respiratory: Negative for chest tightness and shortness of breath.   Cardiovascular: Negative for chest pain and palpitations.  Skin: Negative for rash.     Social History   Tobacco Use  Smoking Status Former Smoker  . Packs/day: 0.50  . Years: 2.00  . Pack years: 1.00  . Quit date: 2015  . Years since quitting: 6.8  Smokeless Tobacco Never Used       Objective:     Wt Readings from Last 3 Encounters:  05/28/18 195 lb (88.5 kg)  05/20/18 198 lb (89.8 kg)  03/04/18 190 lb (86.2 kg)     Exam deferred. Pt. Harboring due to COVID 19. Phone  visit performed.   Assessment & Plan:   1. Body aches   2. Chills   3. Fever, unspecified fever cause     No orders of the defined types were placed in this encounter.   Orders Placed This Encounter  Procedures  . Novel Coronavirus, NAA ([facility])    Order Specific Question:   Is this test for diagnosis or screening    Answer:   Diagnosis of ill patient    Order Specific Question:   Symptomatic for COVID-19 as defined by CDC    Answer:   Yes    Order Specific Question:   Date of Symptom Onset    Answer:   03/23/2020    Order Specific Question:   Hospitalized for COVID-19    Answer:   No    Order Specific Question:   Admitted to ICU for COVID-19    Answer:   No    Order Specific Question:   Previously tested for COVID-19    Answer:   No    Order Specific Question:   Resident in a congregate (group) care setting    Answer:   No    Order Specific Question:   Is the patient student?    Answer:  No    Order Specific Question:   Employed in healthcare setting    Answer:   No    Order Specific Question:   Has patient completed COVID vaccination(s) (2 doses of Pfizer/Moderna 1 dose of [employer])    Answer:   Unknown      Diagnoses and all orders for this visit:  Body aches -     Novel Coronavirus, NAA ([facility])  Chills -     Novel Coronavirus, NAA ([facility])  Fever, unspecified fever cause -     Novel Coronavirus, NAA ([facility])    Virtual Visit via telephone Note  I discussed the limitations, risks, security and privacy concerns of performing an evaluation and management service by telephone and the availability of in person appointments. The patient was identified with two identifiers. Pt.expressed understanding and agreed to proceed. Pt. Is at home. Dr. Darlyn Read is in his office.  Follow Up Instructions:   I discussed the assessment and treatment plan with the patient. The patient was provided an opportunity to ask questions and all were answered. The patient  agreed with the plan and demonstrated an understanding of the instructions.   The patient was advised to call back or seek an in-person evaluation if the symptoms worsen or if the condition fails to improve as anticipated.   Total minutes including chart review and phone contact time: 11   Follow up plan: Return if symptoms worsen or fail to improve.  Mechele Claude, MD [facility]

## 2020-03-27 LAB — SARS-COV-2, NAA 2 DAY TAT

## 2020-03-27 LAB — NOVEL CORONAVIRUS, NAA: SARS-CoV-2, NAA: DETECTED — AB

## 2020-03-28 ENCOUNTER — Telehealth (HOSPITAL_COMMUNITY): Payer: Self-pay

## 2020-03-28 NOTE — Telephone Encounter (Signed)
Called to Discuss with patient about Covid symptoms and the use of the monoclonal antibody infusion for those with mild to moderate Covid symptoms and at a high risk of hospitalization.     Pt declines.

## 2020-06-26 ENCOUNTER — Encounter: Payer: Self-pay | Admitting: Nurse Practitioner

## 2020-06-26 ENCOUNTER — Ambulatory Visit (INDEPENDENT_AMBULATORY_CARE_PROVIDER_SITE_OTHER): Payer: 59 | Admitting: Nurse Practitioner

## 2020-06-26 VITALS — Temp 97.9°F

## 2020-06-26 DIAGNOSIS — J0110 Acute frontal sinusitis, unspecified: Secondary | ICD-10-CM | POA: Insufficient documentation

## 2020-06-26 DIAGNOSIS — J069 Acute upper respiratory infection, unspecified: Secondary | ICD-10-CM | POA: Insufficient documentation

## 2020-06-26 LAB — VERITOR FLU A/B WAIVED
Influenza A: NEGATIVE
Influenza B: NEGATIVE

## 2020-06-26 LAB — RAPID STREP SCREEN (MED CTR MEBANE ONLY): Strep Gp A Ag, IA W/Reflex: NEGATIVE

## 2020-06-26 LAB — CULTURE, GROUP A STREP

## 2020-06-26 MED ORDER — DM-GUAIFENESIN ER 30-600 MG PO TB12: 1.0000 | ORAL_TABLET | Freq: Two times a day (BID) | ORAL | 0 refills | Status: DC

## 2020-06-26 MED ORDER — AMOXICILLIN-POT CLAVULANATE 875-125 MG PO TABS: 1.0000 | ORAL_TABLET | Freq: Two times a day (BID) | ORAL | 0 refills | Status: DC

## 2020-06-26 MED ORDER — ACETAMINOPHEN 500 MG PO TABS: 500.0000 mg | ORAL_TABLET | Freq: Four times a day (QID) | ORAL | 0 refills | Status: DC | PRN

## 2020-06-26 NOTE — Assessment & Plan Note (Signed)
Symptoms not well controlled in the last 2 to 3 days. Patient denies fever, reports night chills. Flu/COVID/strep throat swab ordered. We will treat appropriately.   Follow-up with worsening hours of symptoms.  Rx sent to pharmacy.

## 2020-06-26 NOTE — Addendum Note (Signed)
Addended by: Daryll Drown on: 06/26/2020 10:54 AM   Modules accepted: Orders

## 2020-06-26 NOTE — Patient Instructions (Signed)
Cough, Adult A cough helps to clear your throat and lungs. A cough may be a sign of an illness or another medical condition. An acute cough may only last 2-3 weeks, while a chronic cough may last 8 or more weeks. Many things can cause a cough. They include:  Germs (viruses or bacteria) that attack the airway.  Breathing in things that bother (irritate) your lungs.  Allergies.  Asthma.  Mucus that runs down the back of your throat (postnasal drip).  Smoking.  Acid backing up from the stomach into the tube that moves food from the mouth to the stomach (gastroesophageal reflux).  Some medicines.  Lung problems.  Other medical conditions, such as heart failure or a blood clot in the lung (pulmonary embolism). Follow these instructions at home: Medicines  Take over-the-counter and prescription medicines only as told by your doctor.  Talk with your doctor before you take medicines that stop a cough (cough suppressants). Lifestyle  Do not smoke, and try not to be around smoke. Do not use any products that contain nicotine or tobacco, such as cigarettes, e-cigarettes, and chewing tobacco. If you need help quitting, ask your doctor.  Drink enough fluid to keep your pee (urine) pale yellow.  Avoid caffeine.  Do not drink alcohol if your doctor tells you not to drink.   General instructions  Watch for any changes in your cough. Tell your doctor about them.  Always cover your mouth when you cough.  Stay away from things that make you cough, such as perfume, candles, campfire smoke, or cleaning products.  If the air is dry, use a cool mist vaporizer or humidifier in your home.  If your cough is worse at night, try using extra pillows to raise your head up higher while you sleep.  Rest as needed.  Keep all follow-up visits as told by your doctor. This is important.   Contact a doctor if:  You have new symptoms.  You cough up pus.  Your cough does not get better after 2-3  weeks, or your cough gets worse.  Cough medicine does not help your cough and you are not sleeping well.  You have pain that gets worse or pain that is not helped with medicine.  You have a fever.  You are losing weight and you do not know why.  You have night sweats. Get help right away if:  You cough up blood.  You have trouble breathing.  Your heartbeat is very fast. These symptoms may be an emergency. Do not wait to see if the symptoms will go away. Get medical help right away. Call your local emergency services (911 in the [place] Do not drive yourself to the hospital. Summary  A cough helps to clear your throat and lungs. Many things can cause a cough.  Take over-the-counter and prescription medicines only as told by your doctor.  Always cover your mouth when you cough.  Contact a doctor if you have new symptoms or you have a cough that does not get better or gets worse. This information is not intended to replace advice given to you by your health care provider. Make sure you discuss any questions you have with your health care provider. Document Revised: 07/14/2019 Document Reviewed: 06/13/2018 Elsevier Patient Education  2021 Elsevier Inc.  Sinusitis, Adult Sinusitis is soreness and swelling (inflammation) of your sinuses. Sinuses are hollow spaces in the bones around your face. They are located:  Around your eyes.  In the middle of   your forehead.  Behind your nose.  In your cheekbones. Your sinuses and nasal passages are lined with a fluid called mucus. Mucus drains out of your sinuses. Swelling can trap mucus in your sinuses. This lets germs (bacteria, virus, or fungus) grow, which leads to infection. Most of the time, this condition is caused by a virus. What are the causes? This condition is caused by:  Allergies.  Asthma.  Germs.  Things that block your nose or sinuses.  Growths in the nose (nasal polyps).  Chemicals or irritants in the  air.  Fungus (rare). What increases the risk? You are more likely to develop this condition if:  You have a weak body defense system (immune system).  You do a lot of swimming or diving.  You use nasal sprays too much.  You smoke. What are the signs or symptoms? The main symptoms of this condition are pain and a feeling of pressure around the sinuses. Other symptoms include:  Stuffy nose (congestion).  Runny nose (drainage).  Swelling and warmth in the sinuses.  Headache.  Toothache.  A cough that may get worse at night.  Mucus that collects in the throat or the back of the nose (postnasal drip).  Being unable to smell and taste.  Being very tired (fatigue).  A fever.  Sore throat.  Bad breath. How is this diagnosed? This condition is diagnosed based on:  Your symptoms.  Your medical history.  A physical exam.  Tests to find out if your condition is short-term (acute) or long-term (chronic). Your doctor may: ? Check your nose for growths (polyps). ? Check your sinuses using a tool that has a light (endoscope). ? Check for allergies or germs. ? Do imaging tests, such as an MRI or CT scan. How is this treated? Treatment for this condition depends on the cause and whether it is short-term or long-term.  If caused by a virus, your symptoms should go away on their own within 10 days. You may be given medicines to relieve symptoms. They include: ? Medicines that shrink swollen tissue in the nose. ? Medicines that treat allergies (antihistamines). ? A spray that treats swelling of the nostrils. ? Rinses that help get rid of thick mucus in your nose (nasal saline washes).  If caused by bacteria, your doctor may wait to see if you will get better without treatment. You may be given antibiotic medicine if you have: ? A very bad infection. ? A weak body defense system.  If caused by growths in the nose, you may need to have surgery. Follow these instructions  at home: Medicines  Take, use, or apply over-the-counter and prescription medicines only as told by your doctor. These may include nasal sprays.  If you were prescribed an antibiotic medicine, take it as told by your doctor. Do not stop taking the antibiotic even if you start to feel better. Hydrate and humidify  Drink enough water to keep your pee (urine) pale yellow.  Use a cool mist humidifier to keep the humidity level in your home above 50%.  Breathe in steam for 10-15 minutes, 3-4 times a day, or as told by your doctor. You can do this in the bathroom while a hot shower is running.  Try not to spend time in cool or dry air.   Rest  Rest as much as you can.  Sleep with your head raised (elevated).  Make sure you get enough sleep each night. General instructions  Put a warm, moist   washcloth on your face 3-4 times a day, or as often as told by your doctor. This will help with discomfort.  Wash your hands often with soap and water. If there is no soap and water, use hand sanitizer.  Do not smoke. Avoid being around people who are smoking (secondhand smoke).  Keep all follow-up visits as told by your doctor. This is important.   Contact a doctor if:  You have a fever.  Your symptoms get worse.  Your symptoms do not get better within 10 days. Get help right away if:  You have a very bad headache.  You cannot stop throwing up (vomiting).  You have very bad pain or swelling around your face or eyes.  You have trouble seeing.  You feel confused.  Your neck is stiff.  You have trouble breathing. Summary  Sinusitis is swelling of your sinuses. Sinuses are hollow spaces in the bones around your face.  This condition is caused by tissues in your nose that become inflamed or swollen. This traps germs. These can lead to infection.  If you were prescribed an antibiotic medicine, take it as told by your doctor. Do not stop taking it even if you start to feel  better.  Keep all follow-up visits as told by your doctor. This is important. This information is not intended to replace advice given to you by your health care provider. Make sure you discuss any questions you have with your health care provider. Document Revised: 10/25/2017 Document Reviewed: 10/25/2017 Elsevier Patient Education  2021 Elsevier Inc.  

## 2020-06-26 NOTE — Assessment & Plan Note (Signed)
Telephone visit completed for patient to evaluate subacute frontal sinusitis new in the last 2 days, symptoms not well controlled. Started patient on Augmentin, Tylenol for headache,  COVID-19/strep/flu swab ordered-results pending we will treat appropriately. Follow-up for worsening hours of symptoms.  Medication sent to pharmacy. Provided education to patient, patient verbalized understanding

## 2020-06-26 NOTE — Progress Notes (Signed)
Virtual Visit via telephone Note Due to COVID-19 pandemic this visit was conducted virtually. This visit type was conducted due to national recommendations for restrictions regarding the COVID-19 Pandemic (e.g. social distancing, sheltering in place) in an effort to limit this patient's exposure and mitigate transmission in our community. All issues noted in this document were discussed and addressed.  A physical exam was not performed with this format.  I connected with Andrew Davis on 06/26/20 at 09:30 am  by telephone and verified that I am speaking with the correct person using two identifiers. Andrew Davis is currently located at home during visit. The provider, Daryll Drown, NP is located in their office at time of visit.  I discussed the limitations, risks, security and privacy concerns of performing an evaluation and management service by telephone and the availability of in person appointments. I also discussed with the patient that there may be a patient responsible charge related to this service. The patient expressed understanding and agreed to proceed.   History and Present Illness:  Sinusitis This is a new problem. Episode onset: in the last 2 days. The problem has been gradually worsening since onset. There has been no fever. The pain is moderate. Associated symptoms include chills, congestion, coughing, headaches, sinus pressure and a sore throat. Pertinent negatives include no ear pain, shortness of breath or swollen glands. Past treatments include nothing.  Cough This is a new problem. The current episode started yesterday. The problem has been unchanged. The problem occurs constantly. The cough is non-productive. Associated symptoms include chills, headaches, nasal congestion and a sore throat. Pertinent negatives include no ear pain, fever, shortness of breath, weight loss or wheezing. Nothing aggravates the symptoms. He has tried nothing for the symptoms.      Review  of Systems  Constitutional: Positive for chills. Negative for fever and weight loss.  HENT: Positive for congestion, sinus pressure and sore throat. Negative for ear pain.   Respiratory: Positive for cough. Negative for shortness of breath and wheezing.   Neurological: Positive for headaches.  All other systems reviewed and are negative.    Observations/Objective:  Tele Visit  Assessment and Plan:  Subacute frontal sinusitis Telephone visit completed for patient to evaluate subacute frontal sinusitis new in the last 2 days, symptoms not well controlled. Started patient on Augmentin, Tylenol for headache,  COVID-19/strep/flu swab ordered-results pending we will treat appropriately. Follow-up for worsening hours of symptoms.  Medication sent to pharmacy. Provided education to patient, patient verbalized understanding   Upper respiratory infection with cough and congestion Symptoms not well controlled in the last 2 to 3 days. Patient denies fever, reports night chills. Flu/COVID/strep throat swab ordered. We will treat appropriately.   Follow-up with worsening hours of symptoms.  Rx sent to pharmacy.   Follow Up Instructions: Follow up with worsening or unresolved symptoms   I discussed the assessment and treatment plan with the patient. The patient was provided an opportunity to ask questions and all were answered. The patient agreed with the plan and demonstrated an understanding of the instructions.   The patient was advised to call back or seek an in-person evaluation if the symptoms worsen or if the condition fails to improve as anticipated.  The above assessment and management plan was discussed with the patient. The patient verbalized understanding of and has agreed to the management plan. Patient is aware to call the clinic if symptoms persist or worsen. Patient is aware when to return to the  clinic for a follow-up visit. Patient educated on when it is appropriate to go  to the emergency department.   Time call ended:  09:08 am   I provided 8 minutes of non-face-to-face time during this encounter.    Daryll Drown, NP

## 2020-06-28 LAB — SARS-COV-2, NAA 2 DAY TAT

## 2020-06-28 LAB — NOVEL CORONAVIRUS, NAA: SARS-CoV-2, NAA: NOT DETECTED

## 2020-10-02 ENCOUNTER — Ambulatory Visit (INDEPENDENT_AMBULATORY_CARE_PROVIDER_SITE_OTHER): Payer: 59 | Admitting: Family Medicine

## 2020-10-02 ENCOUNTER — Encounter: Payer: Self-pay | Admitting: Family Medicine

## 2020-10-02 DIAGNOSIS — A084 Viral intestinal infection, unspecified: Secondary | ICD-10-CM | POA: Diagnosis not present

## 2020-10-02 NOTE — Progress Notes (Signed)
   Virtual Visit via Telephone Note  I connected with Andrew Davis on 10/02/20 at 3:10 PM by telephone and verified that I am speaking with the correct person using two identifiers. Andrew Davis is currently located at home and his wife is currently with him during this visit. The provider, Gwenlyn Fudge, FNP is located in their office at time of visit.  I discussed the limitations, risks, security and privacy concerns of performing an evaluation and management service by telephone and the availability of in person appointments. I also discussed with the patient that there may be a patient responsible charge related to this service. The patient expressed understanding and agreed to proceed.  Subjective: PCP: Raliegh Ip, DO  Chief Complaint  Patient presents with  . GI Problem   Patient reports diarrhea, sweating, and chills that started this morning. He is getting better as the day goes on. His wife has similar symptoms. He is in need of a note for work.    ROS: Per HPI  Current Outpatient Medications:  .  acetaminophen (TYLENOL) 500 MG tablet, Take 1 tablet (500 mg total) by mouth every 6 (six) hours as needed., Disp: 30 tablet, Rfl: 0 .  cetirizine (ZYRTEC) 10 MG tablet, Take 1 tablet (10 mg total) by mouth daily., Disp: 30 tablet, Rfl: 11 .  fluticasone (FLONASE) 50 MCG/ACT nasal spray, Place 2 sprays into both nostrils daily., Disp: 16 g, Rfl: 6 .  sertraline (ZOLOFT) 100 MG tablet, Take 1 tablet (100 mg total) by mouth daily., Disp: 30 tablet, Rfl: 3  No Known Allergies History reviewed. No pertinent past medical history.  Observations/Objective: A&O  No respiratory distress or wheezing audible over the phone Mood, judgement, and thought processes all WNL   Assessment and Plan: 1. Viral gastroenteritis Advised not to stop diarrhea and stay well hydrated. Note provided for work today and tomorrow.    Follow Up Instructions:  I discussed the assessment and  treatment plan with the patient. The patient was provided an opportunity to ask questions and all were answered. The patient agreed with the plan and demonstrated an understanding of the instructions.   The patient was advised to call back or seek an in-person evaluation if the symptoms worsen or if the condition fails to improve as anticipated.  The above assessment and management plan was discussed with the patient. The patient verbalized understanding of and has agreed to the management plan. Patient is aware to call the clinic if symptoms persist or worsen. Patient is aware when to return to the clinic for a follow-up visit. Patient educated on when it is appropriate to go to the emergency department.   Time call ended: 3:21 PM  I provided 11 minutes of non-face-to-face time during this encounter.  Deliah Boston, MSN, APRN, FNP-C [facility] 10/02/20

## 2020-11-01 ENCOUNTER — Telehealth: Payer: Self-pay | Admitting: Family Medicine

## 2020-11-01 MED ORDER — OSELTAMIVIR PHOSPHATE 75 MG PO CAPS: 75.0000 mg | ORAL_CAPSULE | Freq: Every day | ORAL | 0 refills | Status: DC

## 2020-11-01 NOTE — Telephone Encounter (Signed)
Tamiflu Prescription sent to pharmacy   

## 2020-11-01 NOTE — Telephone Encounter (Signed)
Patient aware and verbalized understanding. °

## 2021-01-20 ENCOUNTER — Ambulatory Visit (INDEPENDENT_AMBULATORY_CARE_PROVIDER_SITE_OTHER): Payer: 59 | Admitting: Family Medicine

## 2021-01-20 ENCOUNTER — Encounter: Payer: Self-pay | Admitting: Family Medicine

## 2021-01-20 DIAGNOSIS — R509 Fever, unspecified: Secondary | ICD-10-CM

## 2021-01-20 DIAGNOSIS — J029 Acute pharyngitis, unspecified: Secondary | ICD-10-CM

## 2021-01-20 LAB — CULTURE, GROUP A STREP

## 2021-01-20 LAB — RAPID STREP SCREEN (MED CTR MEBANE ONLY): Strep Gp A Ag, IA W/Reflex: NEGATIVE

## 2021-01-20 NOTE — Progress Notes (Signed)
   Virtual Visit  Note Due to COVID-19 pandemic this visit was conducted virtually. This visit type was conducted due to national recommendations for restrictions regarding the COVID-19 Pandemic (e.g. social distancing, sheltering in place) in an effort to limit this patient's exposure and mitigate transmission in our community. All issues noted in this document were discussed and addressed.  A physical exam was not performed with this format.  I connected with Egan Berkheimer Misener on 01/20/21 at [phone] by telephone and verified that I am speaking with the correct person using two identifiers. Salahuddin Arismendez Hughett is currently located at home and his family is currently with him during the visit. The provider, Gabriel Earing, FNP is located in their office at time of visit.  I discussed the limitations, risks, security and privacy concerns of performing an evaluation and management service by telephone and the availability of in person appointments. I also discussed with the patient that there may be a patient responsible charge related to this service. The patient expressed understanding and agreed to proceed.  CC: sore throat  History and Present Illness:  HPI Kaelen reports a sore throat x 2 days. He also had chills, body aches, HA, and a fever of 102 when his symptoms started. Everything has now resolved expect for his sore throat. He reports that it hurts to swallow. He isn't sure if he has any erythema, swelling, or exudate on his tonsils. He denies cough, congestion, cervical lymphadenopathy, chest pain, shortness of breath, nausea, vomiting, or diarrhea. He has taken tylenol and chloraseptic spray with a little relief. He had a negative home Covid test 2 days ago.     ROS As per HPI.   Observations/Objective: Alert and oriented x 3. Able to speak in full sentences without difficulty.   Assessment and Plan: Bless was seen today for sore throat.  Diagnoses and all orders for this  visit:  Sore throat Strep and Covid test pending. Quarantine until results of Covid test. Work note provided. Discussed symptomatic care and return precautions.  -     Novel Coronavirus, NAA ([facility]); Future -     Rapid Strep Screen ([facility] ONLY)  Fever, unspecified fever cause Resolved. Tests pending as below.  -     Novel Coronavirus, NAA ([facility]); Future -     Rapid Strep Screen ([facility] ONLY)    Follow Up Instructions: Return to office for new or worsening symptoms, or if symptoms persist.     I discussed the assessment and treatment plan with the patient. The patient was provided an opportunity to ask questions and all were answered. The patient agreed with the plan and demonstrated an understanding of the instructions.   The patient was advised to call back or seek an in-person evaluation if the symptoms worsen or if the condition fails to improve as anticipated.  The above assessment and management plan was discussed with the patient. The patient verbalized understanding of and has agreed to the management plan. Patient is aware to call the clinic if symptoms persist or worsen. Patient is aware when to return to the clinic for a follow-up visit. Patient educated on when it is appropriate to go to the emergency department.   Time call ended:  0849  I provided 11 minutes of  non face-to-face time during this encounter.    Gabriel Earing, FNP

## 2021-01-22 ENCOUNTER — Other Ambulatory Visit: Payer: Self-pay

## 2021-01-22 DIAGNOSIS — R509 Fever, unspecified: Secondary | ICD-10-CM

## 2021-01-22 DIAGNOSIS — J029 Acute pharyngitis, unspecified: Secondary | ICD-10-CM

## 2021-01-22 NOTE — Progress Notes (Deleted)
W

## 2021-01-23 LAB — NOVEL CORONAVIRUS, NAA: SARS-CoV-2, NAA: NOT DETECTED

## 2021-01-23 LAB — SARS-COV-2, NAA 2 DAY TAT

## 2021-01-25 ENCOUNTER — Encounter: Payer: Self-pay | Admitting: Physician Assistant

## 2021-01-25 ENCOUNTER — Telehealth: Payer: 59 | Admitting: Physician Assistant

## 2021-01-25 DIAGNOSIS — R059 Cough, unspecified: Secondary | ICD-10-CM | POA: Diagnosis not present

## 2021-01-25 DIAGNOSIS — J040 Acute laryngitis: Secondary | ICD-10-CM | POA: Diagnosis not present

## 2021-01-25 DIAGNOSIS — Z20822 Contact with and (suspected) exposure to covid-19: Secondary | ICD-10-CM

## 2021-01-25 DIAGNOSIS — J029 Acute pharyngitis, unspecified: Secondary | ICD-10-CM | POA: Diagnosis not present

## 2021-01-25 MED ORDER — AMOXICILLIN 500 MG PO CAPS: 500.0000 mg | ORAL_CAPSULE | Freq: Two times a day (BID) | ORAL | 0 refills | Status: AC

## 2021-01-25 MED ORDER — BENZONATATE 100 MG PO CAPS: 100.0000 mg | ORAL_CAPSULE | Freq: Two times a day (BID) | ORAL | 0 refills | Status: DC | PRN

## 2021-01-25 NOTE — Progress Notes (Signed)
E-Visit for Corona Virus Screening  Your current symptoms could be consistent with the coronavirus.  Many health care providers can now test patients at their office but not all are.  [place] has multiple testing sites. For information on our COVID testing locations and hours go to [email or web address]  We are enrolling you in our MyChart Home Monitoring for COVID19 . Daily you will receive a questionnaire within the MyChart website. Our COVID 19 response team will be monitoring your responses daily.  Testing Information: The COVID-19 Community Testing sites are testing BY APPOINTMENT ONLY.  You can schedule online at [email or web address]  If you do not have access to a smart phone or computer you may call [phone] for an appointment.   Additional testing sites in the Community:  For [facility] Testing sites in [place]  [email or web address].[email or web address]  For Pop-up testing sites in [place]  [email or web address]  For [email or web address]  For [facility] testing in [place] and [employer] [email or web address]  For [facility] testing in [place]   [email or web address]  For  more information about community testing call [phone]   Please quarantine yourself while awaiting your test results. Please stay home for a minimum of 10 days from the first day of illness with improving symptoms and you have had 24 hours of no fever (without the use of Tylenol (Acetaminophen) Motrin (Ibuprofen) or any fever reducing medication).  Also - Do not get tested prior to returning to work because once you have had a positive test the test can stay positive for  more than a month in some cases.   You should wear a mask or cloth face covering over your nose and mouth if you must be around other people or animals, including pets (even at home). Try to stay at least 6 feet away from other people. This will protect the people around you.  Please continue good preventive care measures, including:  frequent hand-washing, avoid touching your face, cover coughs/sneezes, stay out of crowds and keep a 6 foot distance from others.  COVID-19 is a respiratory illness with symptoms that are similar to the flu. Symptoms are typically mild to moderate, but there have been cases of severe illness and death due to the virus.   The following symptoms may appear 2-14 days after exposure: Fever Cough Shortness of breath or difficulty breathing Chills Repeated shaking with chills Muscle pain Headache Sore throat New loss of taste or smell Fatigue Congestion or runny nose Nausea or vomiting Diarrhea  Go to the nearest hospital ED for assessment if fever/cough/breathlessness are severe or illness seems like a threat to life.  It is vitally important that if you feel that you have an infection such as this virus or any other virus that you stay home and away from places where you may spread it to others.  You should avoid contact with people age 80 and older.   You can use medication such as prescription cough medication called Tessalon Perles 100 mg. You may take 1-2 capsules every 8 hours as needed for cough   Your sore throat may be viral in nature. However, due to chronicity, severity, and accompanying hoarseness, I have sent in Amoxicillin 500 mg twice daily for 10 days.  You may also take acetaminophen (Tylenol) as needed for fever.  Reduce your risk of any infection by using the same precautions used for avoiding the common cold or flu:  Wash your hands often with soap and warm water for at  least 20 seconds.  If soap and water are not readily available, use an  alcohol-based hand sanitizer with at least 60% alcohol.  If coughing or sneezing, cover your mouth and nose by coughing or sneezing into the elbow areas of your shirt or coat, into a tissue or into your sleeve (not your hands). Avoid shaking hands with others and consider head nods or verbal greetings only. Avoid touching your eyes, nose, or mouth with unwashed hands.  Avoid close contact with people who are sick. Avoid places or events with large numbers of people in one location, like concerts or sporting events. Carefully consider travel plans you have or are making. If you are planning any travel outside or inside the [place], visit the CDC's Travelers' Health webpage for the latest health notices. If you have some symptoms but not all symptoms, continue to monitor at home and seek medical attention if your symptoms worsen. If you are having a medical emergency, call 911.  HOME CARE Only take medications as instructed by your medical team. Drink plenty of fluids and get plenty of rest. A steam or ultrasonic humidifier can help if you have congestion.   GET HELP RIGHT AWAY IF YOU HAVE EMERGENCY WARNING SIGNS** FOR COVID-19. If you or someone is showing any of these signs seek emergency medical care immediately. Call 911 or proceed to your closest emergency facility if: You develop worsening high fever. Trouble breathing Bluish lips or face Persistent pain or pressure in the chest New confusion Inability to wake or stay awake You cough up blood. Your symptoms become more severe  **This list is not all possible symptoms. Contact your medical provider for any symptoms that are sever or concerning to you.  MAKE SURE YOU  Understand these instructions. Will watch your condition. Will get help right away if you are not doing well or get worse.  Your e-visit answers were reviewed by a board certified advanced clinical practitioner to complete your personal care plan.  Depending on the  condition, your plan could have included both over the counter or prescription medications.  If there is a problem please reply once you have received a response from your provider.  Your safety is important to [place].  If you have drug allergies check your prescription carefully.    You can use MyChart to ask questions about today's visit, request a non-urgent call back, or ask for a work or school excuse for 24 hours related to this e-Visit. If it has been greater than 24 hours you will need to follow up with your provider, or enter a new e-Visit to address those concerns. You will get an e-mail in the next two days asking about your experience.  I hope that your e-visit has been valuable and will speed your recovery. Thank you for using e-visits.  I spent 5-10 minutes on review and completion of this note- [facility]

## 2021-09-08 ENCOUNTER — Encounter: Payer: Self-pay | Admitting: Family

## 2021-09-08 ENCOUNTER — Ambulatory Visit (INDEPENDENT_AMBULATORY_CARE_PROVIDER_SITE_OTHER): Payer: 59 | Admitting: Family

## 2021-09-08 DIAGNOSIS — J069 Acute upper respiratory infection, unspecified: Secondary | ICD-10-CM

## 2021-09-08 MED ORDER — FLUTICASONE PROPIONATE 50 MCG/ACT NA SUSP: 2.0000 | Freq: Every day | NASAL | 6 refills | Status: DC

## 2021-09-08 MED ORDER — CETIRIZINE HCL 10 MG PO TABS: 10.0000 mg | ORAL_TABLET | Freq: Every day | ORAL | 11 refills | Status: AC

## 2021-09-08 NOTE — Progress Notes (Signed)
? ?Virtual Visit  Note ?Due to COVID-19 pandemic this visit was conducted virtually. This visit type was conducted due to national recommendations for restrictions regarding the COVID-19 Pandemic (e.g. social distancing, sheltering in place) in an effort to limit this patient's exposure and mitigate transmission in our community. All issues noted in this document were discussed and addressed.  A physical exam was not performed with this format. ? ?I connected with Andrew Davis on 09/08/21 at 2:48 pm  by telephone and verified that I am speaking with the correct person using two identifiers. Andrew Davis is currently located at home and daughter is currently with him  during visit. The provider, Jannifer Rodney, FNP is located in their office at time of visit. ? ?I discussed the limitations, risks, security and privacy concerns of performing an evaluation and management service by telephone and the availability of in person appointments. I also discussed with the patient that there may be a patient responsible charge related to this service. The patient expressed understanding and agreed to proceed. ? ?Andrew Davis,you are scheduled for a virtual visit with your provider today.   ? ?Just as we do with appointments in the office, we must obtain your consent to participate.  Your consent will be active for this visit and any virtual visit you may have with one of our providers in the next 365 days.   ? ?If you have a MyChart account, I can also send a copy of this consent to you electronically.  All virtual visits are billed to your insurance company just like a traditional visit in the office.  As this is a virtual visit, video technology does not allow for your provider to perform a traditional examination.  This may limit your provider's ability to fully assess your condition.  If your provider identifies any concerns that need to be evaluated in person or the need to arrange testing such as labs, EKG, etc, we will  make arrangements to do so.   ? ?Although advances in technology are sophisticated, we cannot ensure that it will always work on either your end or our end.  If the connection with a video visit is poor, we may have to switch to a telephone visit.  With either a video or telephone visit, we are not always able to ensure that we have a secure connection.   I need to obtain your verbal consent now.   Are you willing to proceed with your visit today?  ? ?Andrew Davis has provided verbal consent on 09/08/2021 for a virtual visit (video or telephone). ? ? ?Jannifer Rodney, FNP ?09/08/2021  3:00 PM ? ? ? ?History and Present Illness: ? ?URI  ?This is a new problem. The current episode started yesterday. The problem has been gradually worsening. Associated symptoms include congestion, coughing, nausea, sinus pain and a sore throat. Pertinent negatives include no ear pain, neck pain, sneezing or swollen glands. Treatments tried: motrin. The treatment provided mild relief.  ? ? ? ?Review of Systems  ?HENT:  Positive for congestion, sinus pain and sore throat. Negative for ear pain and sneezing.   ?Respiratory:  Positive for cough.   ?Gastrointestinal:  Positive for nausea.  ?Musculoskeletal:  Negative for neck pain.  ? ? ?Observations/Objective: ?No SOB or distress noted, nasal congestion ? ?Assessment and Plan: ?1. Viral URI ?Recommend taking home COVID test to rule out ?- Take meds as prescribed ?- Use a cool mist humidifier  ?-Use saline nose sprays frequently ?-  Force fluids ?-For any cough or congestion ? Use plain Mucinex- regular strength or max strength is fine ?-For fever or aces or pains- take tylenol or ibuprofen. ?-Throat lozenges if help ?-Follow up if symptoms worsen or do not improve  ?- cetirizine (ZYRTEC) 10 MG tablet; Take 1 tablet (10 mg total) by mouth daily.  Dispense: 30 tablet; Refill: 11 ?- fluticasone (FLONASE) 50 MCG/ACT nasal spray; Place 2 sprays into both nostrils daily.  Dispense: 16 g; Refill: 6 ? ?   ?I discussed the assessment and treatment plan with the patient. The patient was provided an opportunity to ask questions and all were answered. The patient agreed with the plan and demonstrated an understanding of the instructions. ?  ?The patient was advised to call back or seek an in-person evaluation if the symptoms worsen or if the condition fails to improve as anticipated. ? ?The above assessment and management plan was discussed with the patient. The patient verbalized understanding of and has agreed to the management plan. Patient is aware to call the clinic if symptoms persist or worsen. Patient is aware when to return to the clinic for a follow-up visit. Patient educated on when it is appropriate to go to the emergency department.  ? ?Time call ended:  2:59 pm  ? ?I provided 11 minutes of  non face-to-face time during this encounter. ? ? ? ?Jannifer Rodney, FNP ? ? ?

## 2021-09-08 NOTE — Patient Instructions (Signed)

## 2021-12-22 ENCOUNTER — Encounter: Payer: Self-pay | Admitting: Family Medicine

## 2021-12-22 ENCOUNTER — Ambulatory Visit (INDEPENDENT_AMBULATORY_CARE_PROVIDER_SITE_OTHER): Payer: 59 | Admitting: Family Medicine

## 2021-12-22 DIAGNOSIS — J069 Acute upper respiratory infection, unspecified: Secondary | ICD-10-CM

## 2021-12-22 NOTE — Progress Notes (Signed)
Telephone visit  Subjective: [id number] PCP: Raliegh Ip, DO CVE:LFYBOFB Andrew Davis is Andrew 31 y.o. male calls for telephone consult today. Patient provides verbal consent for consult held via phone.  Due to COVID-19 pandemic this visit was conducted virtually. This visit type was conducted due to national recommendations for restrictions regarding the COVID-19 Pandemic (e.g. social distancing, sheltering in place) in an effort to limit this patient's exposure and mitigate transmission in our community. All issues noted in this document were discussed and addressed.  Andrew physical exam was not performed with this format.   Location of patient: home Location of provider: [facility] Others present for call: none  1. Febrile illness Onset of chills on Saturday evening.  He notes that he was sweating pretty bad and had subjective fevers.  He reports fatigue.  Missed work yesterday.  Has not taken any home COVID tests.  No known sick contacts. He reports myalgia. No diarrhea, nausea, vomiting.  No rhinorrhea.  Reports head congestion. No shortness of breath, cough.  He is using Flonase in efforts to alleviate symptoms.  This is helping.   ROS: Per HPI  No Known Allergies No past medical history on file.  Current Outpatient Medications:    cetirizine (ZYRTEC) 10 MG tablet, Take 1 tablet (10 mg total) by mouth daily., Disp: 30 tablet, Rfl: 11   fluticasone (FLONASE) 50 MCG/ACT nasal spray, Place 2 sprays into both nostrils daily., Disp: 16 g, Rfl: 6  Assessment/ Plan: 31 y.o. male   Viral URI  Send like Andrew viral URI.  He will do home COVID test and contact me with results.  If positive, I have asked that he take Andrew picture of this so that we can uploaded into his chart and I will extend his note through Thursday.  He otherwise will return to work as directed.  Home care instructions reviewed and reasons for reevaluation discussed.  Follow-up as needed  Start time: 11:01am End time: 11:07a  Total  time spent on patient care (including telephone call/ virtual visit): 6 minutes  Delois Tolbert Hulen Skains, DO [facility] [phone]

## 2022-03-17 ENCOUNTER — Encounter: Payer: Self-pay | Admitting: Family Medicine

## 2022-03-17 ENCOUNTER — Ambulatory Visit (INDEPENDENT_AMBULATORY_CARE_PROVIDER_SITE_OTHER): Payer: 59 | Admitting: Family Medicine

## 2022-03-17 VITALS — BP 136/68 | HR 71 | Temp 97.2°F | Ht 68.0 in | Wt 194.2 lb

## 2022-03-17 DIAGNOSIS — F339 Major depressive disorder, recurrent, unspecified: Secondary | ICD-10-CM | POA: Diagnosis not present

## 2022-03-17 DIAGNOSIS — F411 Generalized anxiety disorder: Secondary | ICD-10-CM | POA: Diagnosis not present

## 2022-03-17 MED ORDER — SERTRALINE HCL 50 MG PO TABS: 50.0000 mg | ORAL_TABLET | Freq: Every day | ORAL | 3 refills | Status: DC

## 2022-03-17 MED ORDER — HYDROXYZINE PAMOATE 25 MG PO CAPS: 25.0000 mg | ORAL_CAPSULE | Freq: Three times a day (TID) | ORAL | 0 refills | Status: DC | PRN

## 2022-03-17 NOTE — Progress Notes (Signed)
Subjective:  Patient ID: Andrew Davis, male    DOB: [birth year], 31 y.o.   MRN: [id number]  Patient Care Team: Janora Norlander, DO as PCP - General (Family Medicine)   Chief Complaint:  Anxiety (Ongoing )   HPI: Andrew Davis is a 31 y.o. male presenting on 03/17/2022 for Anxiety (Ongoing )   Pt presents today for worsening anxiety, mostly social. Was prescribed SSRI therapy in the past but did not take. States the anxiety is starting to interfere with him going out to events with family and friends. Does get anxious when going out to eat. Has not taken anything for symptoms. He denies SI or HI.       03/17/2022    9:20 AM 05/28/2018   11:11 AM 05/20/2018   10:47 AM 03/04/2018    9:11 AM 01/07/2018    8:07 AM  Depression screen PHQ 2/9  Decreased Interest 1 0 0 0 0  Down, Depressed, Hopeless 1 0 0 0 0  PHQ - 2 Score 2 0 0 0 0  Altered sleeping 0      Tired, decreased energy 1      Change in appetite 0      Feeling bad or failure about yourself  0      Trouble concentrating 0      Moving slowly or fidgety/restless 1      Suicidal thoughts 0      PHQ-9 Score 4          03/17/2022    9:20 AM  GAD 7 : Generalized Anxiety Score  Nervous, Anxious, on Edge 2  Control/stop worrying 1  Worry too much - different things 1  Trouble relaxing 1  Restless 1  Easily annoyed or irritable 0  Afraid - awful might happen 0  Total GAD 7 Score 6     Relevant past medical, surgical, family, and social history reviewed and updated as indicated.  Allergies and medications reviewed and updated. Data reviewed: Chart in Epic.   History reviewed. No pertinent past medical history.  History reviewed. No pertinent surgical history.  Social History   Socioeconomic History   Marital status: Married    Spouse name: Not on file   Number of children: Not on file   Years of education: Not on file   Highest education level: Not on file  Occupational History   Not on file   Tobacco Use   Smoking status: Former    Packs/day: 0.50    Years: 2.00    Total pack years: 1.00    Types: Cigarettes    Quit date: 2015    Years since quitting: 8.7   Smokeless tobacco: Never  Vaping Use   Vaping Use: Some days  Substance and Sexual Activity   Alcohol use: Yes    Comment: occasional   Drug use: No   Sexual activity: Yes    Partners: Female    Comment: married  Other Topics Concern   Not on file  Social History Narrative   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   Social Determinants of Health   Financial Resource Strain: Not on file  Food Insecurity: Not on file  Transportation Needs: Not on file  Physical Activity: Not on file  Stress: Not on file  Social Connections: Not on file  Intimate Partner Violence: Not on file    Outpatient Encounter Medications as of 03/17/2022  Medication Sig   hydrOXYzine (VISTARIL) 25 MG capsule Take 1 capsule (25 mg total) by mouth every 8 (eight) hours as needed.   sertraline (ZOLOFT) 50 MG tablet Take 1 tablet (50 mg total) by mouth daily.   cetirizine (ZYRTEC) 10 MG tablet Take 1 tablet (10 mg total) by mouth daily. (Patient not taking: Reported on 03/17/2022)   fluticasone (FLONASE) 50 MCG/ACT nasal spray Place 2 sprays into both nostrils daily. (Patient not taking: Reported on 03/17/2022)   No facility-administered encounter medications on file as of 03/17/2022.    No Known Allergies  Review of Systems  Constitutional:  Positive for activity change, appetite change and fatigue. Negative for chills, diaphoresis, fever and unexpected weight change.  HENT: Negative.    Eyes: Negative.   Respiratory:  Negative for cough, chest tightness and shortness of breath.   Cardiovascular:  Positive for palpitations. Negative for chest pain and leg swelling.  Gastrointestinal:  Negative for abdominal pain, blood in stool, constipation, diarrhea, nausea and vomiting.  Endocrine: Negative.    Genitourinary:  Negative for decreased urine volume, difficulty urinating, dysuria, frequency and urgency.  Musculoskeletal:  Negative for arthralgias and myalgias.  Skin: Negative.   Allergic/Immunologic: Negative.   Neurological:  Negative for dizziness, tremors, seizures, syncope, facial asymmetry, speech difficulty, weakness, light-headedness, numbness and headaches.  Hematological: Negative.   Psychiatric/Behavioral:  Positive for agitation, decreased concentration and sleep disturbance. Negative for behavioral problems, confusion, dysphoric mood, hallucinations, self-injury and suicidal ideas. The patient is nervous/anxious. The patient is not hyperactive.   All other systems reviewed and are negative.       Objective:  BP 136/68   Pulse 71   Temp (!) 97.2 F (36.2 C) (Temporal)   Ht _0  (1.727 m)   Wt 194 lb 3.2 oz (88.1 kg)   SpO2 96%   BMI 29.53 kg/m    Wt Readings from Last 3 Encounters:  03/17/22 194 lb 3.2 oz (88.1 kg)  05/28/18 195 lb (88.5 kg)  05/20/18 198 lb (89.8 kg)    Physical Exam Vitals and nursing note reviewed.  Constitutional:      General: He is not in acute distress.    Appearance: Normal appearance. He is well-developed and well-groomed. He is not ill-appearing, toxic-appearing or diaphoretic.  HENT:     Head: Normocephalic and atraumatic.     Jaw: There is normal jaw occlusion.     Right Ear: Hearing normal.     Left Ear: Hearing normal.     Nose: Nose normal.     Mouth/Throat:     Lips: Pink.     Mouth: Mucous membranes are moist.     Pharynx: Oropharynx is clear. Uvula midline.  Eyes:     General: Lids are normal.     Extraocular Movements: Extraocular movements intact.     Conjunctiva/sclera: Conjunctivae normal.     Pupils: Pupils are equal, round, and reactive to light.  Neck:     Thyroid: No thyroid mass, thyromegaly or thyroid tenderness.     Vascular: No carotid bruit or JVD.     Trachea: Trachea and phonation normal.   Cardiovascular:     Rate and Rhythm: Regular rhythm.     Chest Wall: PMI is not displaced.     Pulses: Normal pulses.     Heart sounds: Normal heart sounds. No murmur heard.    No friction rub. No gallop.  Pulmonary:     Effort: Pulmonary effort is normal. No respiratory distress.  Breath sounds: Normal breath sounds. No wheezing.  Abdominal:     General: Bowel sounds are normal. There is no distension or abdominal bruit.     Palpations: Abdomen is soft. There is no hepatomegaly or splenomegaly.     Tenderness: There is no abdominal tenderness. There is no right CVA tenderness or left CVA tenderness.     Hernia: No hernia is present.  Musculoskeletal:        General: Normal range of motion.     Cervical back: Normal range of motion and neck supple.     Right lower leg: No edema.     Left lower leg: No edema.  Lymphadenopathy:     Cervical: No cervical adenopathy.  Skin:    General: Skin is warm and dry.     Capillary Refill: Capillary refill takes less than 2 seconds.     Coloration: Skin is not cyanotic, jaundiced or pale.     Findings: No rash.  Neurological:     General: No focal deficit present.     Mental Status: He is alert and oriented to person, place, and time.     Sensory: Sensation is intact.     Motor: Motor function is intact.     Coordination: Coordination is intact.     Gait: Gait is intact.     Deep Tendon Reflexes: Reflexes are normal and symmetric.  Psychiatric:        Attention and Perception: Attention and perception normal.        Mood and Affect: Mood and affect normal.        Speech: Speech normal.        Behavior: Behavior normal. Behavior is cooperative.        Thought Content: Thought content normal.        Cognition and Memory: Cognition and memory normal.        Judgment: Judgment normal.     Results for orders placed or performed in visit on 01/22/21  Novel Coronavirus, NAA ([facility])   Specimen: Nasopharyngeal(NP) swabs in vial  transport medium  Result Value Ref Range   SARS-CoV-2, NAA Not Detected Not Detected  SARS-COV-2, NAA 2 DAY TAT  Result Value Ref Range   SARS-CoV-2, NAA 2 DAY TAT Performed        Pertinent labs & imaging results that were available during my care of the patient were reviewed by me and considered in my medical decision making.  Assessment & Plan:  Andrew Davis was seen today for anxiety.  Diagnoses and all orders for this visit:  Generalized anxiety disorder Depression, recurrent ([place]) Was on medications in the past but did not take as prescribed. Will check labs to evaluate for potential underlying causes. Restart SSRI therapy. Will take 1/2 tab for first week and then increase to 1 tab. Denies SI or HI. Vistaril as needed for acute anxiety. Follow up in 4-6 weeks for reevaluation, sooner if warranted.  -     Thyroid Panel With TSH -     hydrOXYzine (VISTARIL) 25 MG capsule; Take 1 capsule (25 mg total) by mouth every 8 (eight) hours as needed. -     sertraline (ZOLOFT) 50 MG tablet; Take 1 tablet (50 mg total) by mouth daily. -     CBC with Differential/Platelet -     CMP14+EGFR     Continue all other maintenance medications.  Follow up plan: Return in about 4 weeks (around 04/14/2022), or if symptoms worsen or fail to improve.  Continue healthy lifestyle choices, including diet (rich in fruits, vegetables, and lean proteins, and low in salt and simple carbohydrates) and exercise (at least 30 minutes of moderate physical activity daily).  Educational handout given for depression  The above assessment and management plan was discussed with the patient. The patient verbalized understanding of and has agreed to the management plan. Patient is aware to call the clinic if they develop any new symptoms or if symptoms persist or worsen. Patient is aware when to return to the clinic for a follow-up visit. Patient educated on when it is appropriate to go to the emergency department.    Monia Pouch, FNP-C [facility] [phone]

## 2022-03-17 NOTE — Patient Instructions (Signed)

## 2022-03-18 LAB — CBC WITH DIFFERENTIAL/PLATELET
Basophils Absolute: 0.1 10*3/uL (ref 0.0–0.2)
Basos: 1 %
EOS (ABSOLUTE): 0.2 10*3/uL (ref 0.0–0.4)
Eos: 3 %
Hematocrit: 43.4 % (ref 37.5–51.0)
Hemoglobin: 15.3 g/dL (ref 13.0–17.7)
Immature Grans (Abs): 0 10*3/uL (ref 0.0–0.1)
Immature Granulocytes: 0 %
Lymphocytes Absolute: 2.6 10*3/uL (ref 0.7–3.1)
Lymphs: 53 %
MCH: 31.7 pg (ref 26.6–33.0)
MCHC: 35.3 g/dL (ref 31.5–35.7)
MCV: 90 fL (ref 79–97)
Monocytes Absolute: 0.3 10*3/uL (ref 0.1–0.9)
Monocytes: 7 %
Neutrophils Absolute: 1.7 10*3/uL (ref 1.4–7.0)
Neutrophils: 36 %
Platelets: 168 10*3/uL (ref 150–450)
RBC: 4.83 x10E6/uL (ref 4.14–5.80)
RDW: 11.9 % (ref 11.6–15.4)
WBC: 4.9 10*3/uL (ref 3.4–10.8)

## 2022-03-18 LAB — CMP14+EGFR
ALT: 100 IU/L — ABNORMAL HIGH (ref 0–44)
AST: 61 IU/L — ABNORMAL HIGH (ref 0–40)
Albumin/Globulin Ratio: 2 (ref 1.2–2.2)
Albumin: 5.1 g/dL (ref 4.1–5.1)
Alkaline Phosphatase: 65 IU/L (ref 44–121)
BUN/Creatinine Ratio: 12 (ref 9–20)
BUN: 11 mg/dL (ref 6–20)
Bilirubin Total: 0.7 mg/dL (ref 0.0–1.2)
CO2: 21 mmol/L (ref 20–29)
Calcium: 9.9 mg/dL (ref 8.7–10.2)
Chloride: 102 mmol/L (ref 96–106)
Creatinine, Ser: 0.94 mg/dL (ref 0.76–1.27)
Globulin, Total: 2.6 g/dL (ref 1.5–4.5)
Glucose: 90 mg/dL (ref 70–99)
Potassium: 3.8 mmol/L (ref 3.5–5.2)
Sodium: 141 mmol/L (ref 134–144)
Total Protein: 7.7 g/dL (ref 6.0–8.5)
eGFR: 111 mL/min/{1.73_m2} (ref >59)

## 2022-03-18 LAB — THYROID PANEL WITH TSH
Free Thyroxine Index: 2.3 (ref 1.2–4.9)
T3 Uptake Ratio: 30 % (ref 24–39)
T4, Total: 7.7 ug/dL (ref 4.5–12.0)
TSH: 2.05 u[IU]/mL (ref 0.450–4.500)

## 2022-04-14 ENCOUNTER — Encounter: Payer: Self-pay | Admitting: Family Medicine

## 2022-04-14 ENCOUNTER — Ambulatory Visit (INDEPENDENT_AMBULATORY_CARE_PROVIDER_SITE_OTHER): Payer: 59 | Admitting: Family Medicine

## 2022-04-14 DIAGNOSIS — F411 Generalized anxiety disorder: Secondary | ICD-10-CM | POA: Diagnosis not present

## 2022-04-14 DIAGNOSIS — F339 Major depressive disorder, recurrent, unspecified: Secondary | ICD-10-CM | POA: Diagnosis not present

## 2022-04-14 MED ORDER — HYDROXYZINE PAMOATE 25 MG PO CAPS: 25.0000 mg | ORAL_CAPSULE | Freq: Three times a day (TID) | ORAL | 0 refills | Status: AC | PRN

## 2022-04-14 MED ORDER — SERTRALINE HCL 50 MG PO TABS: 50.0000 mg | ORAL_TABLET | Freq: Every day | ORAL | 4 refills | Status: AC

## 2022-04-14 NOTE — Progress Notes (Signed)
Subjective: CC: Follow-up anxiety depression PCP: Janora Norlander, DO QIO:[id number] Andrew Davis is Andrew 31 y.o. male presenting to clinic today for:  1.  Anxiety and depression Patient was seen roughly 4 weeks ago by Monia Pouch for anxiety and depression.  He reports that social anxiety has improved substantially since he started the medication.  He feels like he is able to be involved socially more easily and is much less apprehensive about even considering social activities.  Panic attacks have gotten Andrew lot better.  He has only had to utilize the hydroxyzine roughly 5 times.  He denies any excessive daytime drowsiness, nausea or vomiting with the sertraline.  Andrew Davis is Andrew little more but is not sure if that is related to the medication.  Would like to continue on current regimen   ROS: Per HPI  No Known Allergies History reviewed. No pertinent past medical history.  Current Outpatient Medications:    hydrOXYzine (VISTARIL) 25 MG capsule, Take 1 capsule (25 mg total) by mouth every 8 (eight) hours as needed., Disp: 30 capsule, Rfl: 0   sertraline (ZOLOFT) 50 MG tablet, Take 1 tablet (50 mg total) by mouth daily., Disp: 30 tablet, Rfl: 3   cetirizine (ZYRTEC) 10 MG tablet, Take 1 tablet (10 mg total) by mouth daily. (Patient not taking: Reported on 03/17/2022), Disp: 30 tablet, Rfl: 11   fluticasone (FLONASE) 50 MCG/ACT nasal spray, Place 2 sprays into both nostrils daily. (Patient not taking: Reported on 03/17/2022), Disp: 16 g, Rfl: 6 Social History   Socioeconomic History   Marital status: Married    Spouse name: Not on file   Number of children: Not on file   Years of education: Not on file   Highest education level: Not on file  Occupational History   Not on file  Tobacco Use   Smoking status: Former    Packs/day: 0.50    Years: 2.00    Total pack years: 1.00    Types: Cigarettes    Quit date: 2015    Years since quitting: 8.8   Smokeless tobacco: Never  Vaping Use   Vaping  Use: Some days  Substance and Sexual Activity   Alcohol use: Yes    Comment: occasional   Drug use: No   Sexual activity: Yes    Partners: Female    Comment: married  Other Topics Concern   Not on file  Social History Narrative   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   Social Determinants of Health   Financial Resource Strain: Not on file  Food Insecurity: Not on file  Transportation Needs: Not on file  Physical Activity: Not on file  Stress: Not on file  Social Connections: Not on file  Intimate Partner Violence: Not on file   Family History  Problem Relation Age of Onset   COPD Mother    Throat cancer Paternal Grandmother    Colon cancer Paternal Grandfather 64    Objective: Office vital signs reviewed. BP 123/79   Pulse 64   Temp 98.1 F (36.7 C)   Ht 5\' 8"  (1.727 m)   Wt 194 lb 3.2 oz (88.1 kg)   SpO2 95%   BMI 29.53 kg/m   Physical Examination:  General: Awake, alert, well nourished, No acute distress Psych: Mood stable, speech normal, affect appropriate.  Good eye contact.  Does not appear to be responding to internal stimuli.  Thought process linear  04/14/2022    2:17 PM 03/17/2022    9:20 AM 05/28/2018   11:11 AM  Depression screen PHQ 2/9  Decreased Interest 0 1 0  Down, Depressed, Hopeless 0 1 0  PHQ - 2 Score 0 2 0  Altered sleeping  0   Tired, decreased energy  1   Change in appetite  0   Feeling bad or failure about yourself   0   Trouble concentrating  0   Moving slowly or fidgety/restless  1   Suicidal thoughts  0   PHQ-9 Score  4       04/14/2022    2:17 PM 03/17/2022    9:20 AM  GAD 7 : Generalized Anxiety Score  Nervous, Anxious, on Edge 0 2  Control/stop worrying 0 1  Worry too much - different things 0 1  Trouble relaxing 0 1  Restless 0 1  Easily annoyed or irritable 0 0  Afraid - awful might happen 0 0  Total GAD 7 Score 0 6    Assessment/ Plan: 31 y.o. male   Generalized  anxiety disorder - Plan: sertraline (ZOLOFT) 50 MG tablet, hydrOXYzine (VISTARIL) 25 MG capsule  Depression, recurrent (HCC) - Plan: sertraline (ZOLOFT) 50 MG tablet, hydrOXYzine (VISTARIL) 25 MG capsule  Responding to the new SSRI extremely well.  No adverse side effects.  Feels that symptoms are controlled so we will continue current regimen.  Encouraged him to contact me if he needs any dose adjustments going forward.  Vistaril sent for as needed use.  Follow-up in 1 year or as needed  No orders of the defined types were placed in this encounter.  No orders of the defined types were placed in this encounter.    Raliegh Ip, DO [facility] [phone]

## 2022-06-21 ENCOUNTER — Telehealth: Payer: 59 | Admitting: Physician Assistant

## 2022-06-21 DIAGNOSIS — J069 Acute upper respiratory infection, unspecified: Secondary | ICD-10-CM

## 2022-06-21 MED ORDER — FLUTICASONE PROPIONATE 50 MCG/ACT NA SUSP: 2.0000 | Freq: Every day | NASAL | 0 refills | Status: DC

## 2022-06-21 MED ORDER — BENZONATATE 100 MG PO CAPS: 100.0000 mg | ORAL_CAPSULE | Freq: Three times a day (TID) | ORAL | 0 refills | Status: DC | PRN

## 2022-06-21 NOTE — Progress Notes (Signed)
E-Visit for Upper Respiratory Infection   We are sorry you are not feeling well.  Here is how we plan to help!  Based on what you have shared with me, it looks like you may have a viral upper respiratory infection.  Upper respiratory infections are caused by a large number of viruses; however, rhinovirus is the most common cause.   Symptoms vary from person to person, with common symptoms including sore throat, cough, fatigue or lack of energy and feeling of general discomfort.  A low-grade fever of up to 100.4 may present, but is often uncommon.  Symptoms vary however, and are closely related to a person's age or underlying illnesses.  The most common symptoms associated with an upper respiratory infection are nasal discharge or congestion, cough, sneezing, headache and pressure in the ears and face.  These symptoms usually persist for about 3 to 10 days, but can last up to 2 weeks.  It is important to know that upper respiratory infections do not cause serious illness or complications in most cases.    Upper respiratory infections can be transmitted from person to person, with the most common method of transmission being a person's hands.  The virus is able to live on the skin and can infect other persons for up to 2 hours after direct contact.  Also, these can be transmitted when someone coughs or sneezes; thus, it is important to cover the mouth to reduce this risk.  To keep the spread of the illness at bay, good hand hygiene is very important.  This is an infection that is most likely caused by a virus. There are no specific treatments other than to help you with the symptoms until the infection runs its course.  We are sorry you are not feeling well.  Here is how we plan to help!   For nasal congestion, you may use an oral decongestants such as Mucinex D or if you have glaucoma or high blood pressure use plain Mucinex.  Saline nasal spray or nasal drops can help and can safely be used as often as  needed for congestion.  For your congestion, I have prescribed Fluticasone nasal spray one spray in each nostril twice a day  If you do not have a history of heart disease, hypertension, diabetes or thyroid disease, prostate/bladder issues or glaucoma, you may also use Sudafed to treat nasal congestion.  It is highly recommended that you consult with a pharmacist or your primary care physician to ensure this medication is safe for you to take.     If you have a cough, you may use cough suppressants such as Delsym and Robitussin.  If you have glaucoma or high blood pressure, you can also use Coricidin HBP.   For cough I have prescribed for you A prescription cough medication called Tessalon Perles 100 mg. You may take 1-2 capsules every 8 hours as needed for cough  If you have a sore or scratchy throat, use a saltwater gargle-  to  teaspoon of salt dissolved in a 4-ounce to 8-ounce glass of warm water.  Gargle the solution for approximately 15-30 seconds and then spit.  It is important not to swallow the solution.  You can also use throat lozenges/cough drops and Chloraseptic spray to help with throat pain or discomfort.  Warm or cold liquids can also be helpful in relieving throat pain.  For headache, pain or general discomfort, you can use Ibuprofen or Tylenol as directed.   Some authorities believe   that zinc sprays or the use of Echinacea may shorten the course of your symptoms.   HOME CARE Only take medications as instructed by your medical team. Be sure to drink plenty of fluids. Water is fine as well as fruit juices, sodas and electrolyte beverages. You may want to stay away from caffeine or alcohol. If you are nauseated, try taking small sips of liquids. How do you know if you are getting enough fluid? Your urine should be a pale yellow or almost colorless. Get rest. Taking a steamy shower or using a humidifier may help nasal congestion and ease sore throat pain. You can place a towel over  your head and breathe in the steam from hot water coming from a faucet. Using a saline nasal spray works much the same way. Cough drops, hard candies and sore throat lozenges may ease your cough. Avoid close contacts especially the very young and the elderly Cover your mouth if you cough or sneeze Always remember to wash your hands.   GET HELP RIGHT AWAY IF: You develop worsening fever. If your symptoms do not improve within 10 days You develop yellow or green discharge from your nose over 3 days. You have coughing fits You develop a severe head ache or visual changes. You develop shortness of breath, difficulty breathing or start having chest pain Your symptoms persist after you have completed your treatment plan  MAKE SURE YOU  Understand these instructions. Will watch your condition. Will get help right away if you are not doing well or get worse.  Thank you for choosing an e-visit.  Your e-visit answers were reviewed by a board certified advanced clinical practitioner to complete your personal care plan. Depending upon the condition, your plan could have included both over the counter or prescription medications.  Please review your pharmacy choice. Make sure the pharmacy is open so you can pick up prescription now. If there is a problem, you may contact your provider through MyChart messaging and have the prescription routed to another pharmacy.  Your safety is important to us. If you have drug allergies check your prescription carefully.   For the next 24 hours you can use MyChart to ask questions about today's visit, request a non-urgent call back, or ask for a work or school excuse. You will get an email in the next two days asking about your experience. I hope that your e-visit has been valuable and will speed your recovery.  I have spent 5 minutes in review of e-visit questionnaire, review and updating patient chart, medical decision making and response to patient.    Latravis Grine M Shalece Staffa, PA-C  

## 2022-10-27 ENCOUNTER — Encounter: Payer: Self-pay | Admitting: Family Medicine

## 2023-05-18 ENCOUNTER — Encounter: Payer: Self-pay | Admitting: Family Medicine

## 2023-05-18 NOTE — Progress Notes (Unsigned)
Andrew Davis is a 32 y.o. male presents to office today for annual physical exam examination.    Concerns today include: 1. ***  Occupation: ***, Marital status: ***, Substance use: *** Health Maintenance Due  Topic Date Due   INFLUENZA VACCINE  01/07/2023   COVID-19 Vaccine (1 - 2023-24 season) Never done   Refills needed today: ***  Immunization History  Administered Date(s) Administered   DTaP 05/29/1991, 10/31/1991, 12/26/1991, 07/22/1993, 12/20/1995   HIB (PRP-OMP) 05/29/1991, 10/31/1991, 12/26/1991, 07/22/1993   Hepatitis B 07/22/1993, 09/23/1994, 12/20/1995   IPV 05/29/1991, 10/31/1991, 07/22/1993, 12/20/1995   Influenza,inj,Quad PF,6+ Mos 04/24/2019   MMR 07/22/1993, 12/20/1995   Tdap 09/07/2015   No past medical history on file. Social History   Socioeconomic History   Marital status: Married    Spouse name: Not on file   Number of children: Not on file   Years of education: Not on file   Highest education level: Not on file  Occupational History   Not on file  Tobacco Use   Smoking status: Former    Current packs/day: 0.00    Average packs/day: 0.5 packs/day for 2.0 years (1.0 ttl pk-yrs)    Types: Cigarettes    Start date: 2013    Quit date: 2015    Years since quitting: 9.9   Smokeless tobacco: Never  Vaping Use   Vaping status: Some Days  Substance and Sexual Activity   Alcohol use: Yes    Comment: occasional   Drug use: No   Sexual activity: Yes    Partners: Female    Comment: married  Other Topics Concern   Not on file  Social History Narrative   Patient works on Scientist, 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  He has no children.   Social Determinants of Health   Financial Resource Strain: Not on file  Food Insecurity: Not on file  Transportation Needs: Not on file  Physical Activity: Not on file  Stress: Not on file  Social Connections: Not on file  Intimate Partner Violence: Not on file   No past surgical history on file. Family History   Problem Relation Age of Onset   COPD Mother    Throat cancer Paternal Grandmother    Colon cancer Paternal Grandfather 46    Current Outpatient Medications:    cetirizine (ZYRTEC) 10 MG tablet, Take 1 tablet (10 mg total) by mouth daily. (Patient not taking: Reported on 03/17/2022), Disp: 30 tablet, Rfl: 11   fluticasone (FLONASE) 50 MCG/ACT nasal spray, Place 2 sprays into both nostrils daily., Disp: 16 g, Rfl: 0   hydrOXYzine (VISTARIL) 25 MG capsule, Take 1 capsule (25 mg total) by mouth every 8 (eight) hours as needed., Disp: 30 capsule, Rfl: 0   sertraline (ZOLOFT) 50 MG tablet, Take 1 tablet (50 mg total) by mouth daily., Disp: 90 tablet, Rfl: 4  No Known Allergies   ROS: Review of Systems {ros; complete:30496}    Physical exam {Exam, Complete:[id number]}      04/14/2022    2:17 PM 03/17/2022    9:20 AM 05/28/2018   11:11 AM  Depression screen PHQ 2/9  Decreased Interest 0 1 0  Down, Depressed, Hopeless 0 1 0  PHQ - 2 Score 0 2 0  Altered sleeping  0   Tired, decreased energy  1   Change in appetite  0   Feeling bad or failure about yourself   0   Trouble concentrating  0   Moving slowly or  fidgety/restless  1   Suicidal thoughts  0   PHQ-9 Score  4       04/14/2022    2:17 PM 03/17/2022    9:20 AM  GAD 7 : Generalized Anxiety Score  Nervous, Anxious, on Edge 0 2  Control/stop worrying 0 1  Worry too much - different things 0 1  Trouble relaxing 0 1  Restless 0 1  Easily annoyed or irritable 0 0  Afraid - awful might happen 0 0  Total GAD 7 Score 0 6     Assessment/ Plan: Silvestre Mesi Machi here for annual physical exam.   Annual physical exam  Generalized anxiety disorder  Depression, recurrent (HCC)  ***  Counseled on healthy lifestyle choices, including diet (rich in fruits, vegetables and lean meats and low in salt and simple carbohydrates) and exercise (at least 30 minutes of moderate physical activity daily).  Patient to follow up  ***  Sarely Stracener M. Nadine Counts, DO

## 2024-05-15 ENCOUNTER — Ambulatory Visit: Payer: Self-pay

## 2024-05-15 ENCOUNTER — Telehealth: Admitting: Family

## 2024-05-15 DIAGNOSIS — R6889 Other general symptoms and signs: Secondary | ICD-10-CM

## 2024-05-15 MED ORDER — BENZONATATE 200 MG PO CAPS: 200.0000 mg | ORAL_CAPSULE | Freq: Two times a day (BID) | ORAL | 0 refills | Status: AC | PRN

## 2024-05-15 MED ORDER — PROMETHAZINE-DM 6.25-15 MG/5ML PO SYRP: 5.0000 mL | ORAL_SOLUTION | Freq: Three times a day (TID) | ORAL | 0 refills | Status: AC | PRN

## 2024-05-15 MED ORDER — FLUTICASONE PROPIONATE 50 MCG/ACT NA SUSP: 2.0000 | Freq: Every day | NASAL | 6 refills | Status: AC

## 2024-05-15 NOTE — Patient Instructions (Signed)

## 2024-05-15 NOTE — Telephone Encounter (Signed)
 Appt made.

## 2024-05-15 NOTE — Progress Notes (Signed)
 Virtual Visit Consent   Andrew Davis, you are scheduled for a virtual visit with a [facility] provider today. Just as with appointments in the office, your consent must be obtained to participate. Your consent will be active for this visit and any virtual visit you may have with one of our providers in the next 365 days. If you have a MyChart account, a copy of this consent can be sent to you electronically.  As this is a virtual visit, video technology does not allow for your provider to perform a traditional examination. This may limit your provider's ability to fully assess your condition. If your provider identifies any concerns that need to be evaluated in person or the need to arrange testing (such as labs, EKG, etc.), we will make arrangements to do so. Although advances in technology are sophisticated, we cannot ensure that it will always work on either your end or our end. If the connection with a video visit is poor, the visit may have to be switched to a telephone visit. With either a video or telephone visit, we are not always able to ensure that we have a secure connection.  By engaging in this virtual visit, you consent to the provision of healthcare and authorize for your insurance to be billed (if applicable) for the services provided during this visit. Depending on your insurance coverage, you may receive a charge related to this service.  I need to obtain your verbal consent now. Are you willing to proceed with your visit today? Andrew Davis has provided verbal consent on 05/15/2024 for a virtual visit (video or telephone). Bari Learn, FNP  Date: 05/15/2024 12:37 PM   Virtual Visit via Video Note   I, Bari Learn, connected with  Andrew Davis  ([id number], [birth year]) on 05/15/24 at  3:05 PM EST by a video-enabled telemedicine application and verified that I am speaking with the correct person using two identifiers.  Location: Patient: Virtual Visit Location Patient:  Home Provider: Virtual Visit Location Provider: Home Office   I discussed the limitations of evaluation and management by telemedicine and the availability of in person appointments. The patient expressed understanding and agreed to proceed.    History of Present Illness: Andrew Davis is a 33 y.o. who identifies as a male who was assigned male at birth, and is being seen today for fever, chills, congestion, and body aches that started yesterday.  HPI: URI  This is a new problem. The current episode started yesterday. The problem has been unchanged. The maximum temperature recorded prior to his arrival was 102 - 102.9 F. Associated symptoms include congestion, coughing, headaches, joint pain, rhinorrhea, sinus pain and a sore throat. Pertinent negatives include no ear pain, nausea, sneezing or vomiting. He has tried increased fluids and acetaminophen  for the symptoms. The treatment provided mild relief.    Problems:  Patient Active Problem List   Diagnosis Date Noted   Depression, recurrent 03/17/2022   Subacute frontal sinusitis 06/26/2020   Elevated liver enzymes 03/07/2018   Mixed hyperlipidemia 03/07/2018   Generalized anxiety disorder 09/22/2017   Panic attacks 09/22/2017    Allergies: No Known Allergies Medications:  Current Outpatient Medications:    benzonatate  (TESSALON ) 200 MG capsule, Take 1 capsule (200 mg total) by mouth 2 (two) times daily as needed for cough., Disp: 20 capsule, Rfl: 0   fluticasone  (FLONASE ) 50 MCG/ACT nasal spray, Place 2 sprays into both nostrils daily., Disp: 16 g, Rfl: 6   promethazine -dextromethorphan (PROMETHAZINE -DM)  6.25-15 MG/5ML syrup, Take 5 mLs by mouth 3 (three) times daily as needed for cough., Disp: 118 mL, Rfl: 0   cetirizine  (ZYRTEC ) 10 MG tablet, Take 1 tablet (10 mg total) by mouth daily. (Patient not taking: Reported on 03/17/2022), Disp: 30 tablet, Rfl: 11   hydrOXYzine  (VISTARIL ) 25 MG capsule, Take 1 capsule (25 mg total) by mouth  every 8 (eight) hours as needed., Disp: 30 capsule, Rfl: 0   sertraline  (ZOLOFT ) 50 MG tablet, Take 1 tablet (50 mg total) by mouth daily., Disp: 90 tablet, Rfl: 4  Observations/Objective: Patient is well-developed, well-nourished in no acute distress.  Resting comfortably  at home.  Head is normocephalic, atraumatic.  No labored breathing.  Speech is clear and coherent with logical content.  Patient is alert and oriented at baseline.  Nasal congestion   Assessment and Plan: 1. Flu-like symptoms (Primary) - benzonatate  (TESSALON ) 200 MG capsule; Take 1 capsule (200 mg total) by mouth 2 (two) times daily as needed for cough.  Dispense: 20 capsule; Refill: 0 - promethazine -dextromethorphan (PROMETHAZINE -DM) 6.25-15 MG/5ML syrup; Take 5 mLs by mouth 3 (three) times daily as needed for cough.  Dispense: 118 mL; Refill: 0 - fluticasone  (FLONASE ) 50 MCG/ACT nasal spray; Place 2 sprays into both nostrils daily.  Dispense: 16 g; Refill: 6  - Take meds as prescribed - Use a cool mist humidifier  -Use saline nose sprays frequently -Force fluids -For any cough or congestion  Use plain Mucinex - regular strength or max strength is fine -For fever or aces or pains- take tylenol  or ibuprofen. -Throat lozenges if help -Work note given  Follow up if symptoms worsen or do not improve   Follow Up Instructions: I discussed the assessment and treatment plan with the patient. The patient was provided an opportunity to ask questions and all were answered. The patient agreed with the plan and demonstrated an understanding of the instructions.  A copy of instructions were sent to the patient via MyChart unless otherwise noted below.     The patient was advised to call back or seek an in-person evaluation if the symptoms worsen or if the condition fails to improve as anticipated.    Bari Learn, FNP

## 2024-05-15 NOTE — Telephone Encounter (Signed)
 FYI Only or Action Required?: FYI only for provider: appointment scheduled on scheduled for video visit with PCP office today at 3:05 PM.  Patient was last seen in primary care on 04/14/2022 by Jolinda Norene HERO, DO.  Called Nurse Triage reporting Cough.  Symptoms began yesterday.  Interventions attempted: OTC medications: ibuprofen and Rest, hydration, or home remedies.  Symptoms are: unchanged.  Triage Disposition: See Physician Within 24 Hours  Patient/caregiver understands and will follow disposition?: Yes  Copied from CRM #[id number]. Topic: Clinical - Red Word Triage >> May 15, 2024 11:57 AM Willma SAUNDERS wrote: [facility] that prompted transfer to Nurse Triage: Patient states since yesterday, had a fever all night of 102, body aches, sweat, chills and cough with discolored mucous. States feels like the flu or covid. Reason for Disposition  SEVERE coughing spells (e.g., whooping sound after coughing, vomiting after coughing)  Answer Assessment - Initial Assessment Questions Reports fever of 102-current temperature is 98 due to ibuprofen. Reports cough with yellow sputum, body aches and fatigue. No exposure to COVID and FLU that he knows of. Symptoms started yesterday. Requesting a virtual appointment. Scheduled for virtual appointment with PCP office today at 3:05 PM  1. ONSET: When did the cough begin?      Started yesterday 2. SEVERITY: How bad is the cough today?      moderate 3. SPUTUM: Describe the color of your sputum (e.g., none, dry cough; clear, white, yellow, green)     yellow 4. HEMOPTYSIS: Are you coughing up any blood? If Yes, ask: How much? (e.g., flecks, streaks, tablespoons, etc.)     no 5. DIFFICULTY BREATHING: Are you having difficulty breathing? If Yes, ask: How bad is it? (e.g., mild, moderate, severe)      no 6. FEVER: Do you have a fever? If Yes, ask: What is your temperature, how was it measured, and when did it start?     Not currently but  ran a temperature of 102-recent temperature is 98 7. CARDIAC HISTORY: Do you have any history of heart disease? (e.g., heart attack, congestive heart failure)      no 8. LUNG HISTORY: Do you have any history of lung disease?  (e.g., pulmonary embolus, asthma, emphysema)     no 9. PE RISK FACTORS: Do you have a history of blood clots? (or: recent major surgery, recent prolonged travel, bedridden)     no 10. OTHER SYMPTOMS: Do you have any other symptoms? (e.g., runny nose, wheezing, chest pain)       Chills, sweating, body aches, fatigue. 12. TRAVEL: Have you traveled out of the country in the last month? (e.g., travel history, exposures)       no  Protocols used: Cough - Acute Productive-A-AH
